# Patient Record
Sex: Female | Born: 1970 | Race: White | Hispanic: No | Smoking: Never smoker
Health system: Southern US, Community
[De-identification: ages and names within clinical notes are randomized; demographics above are authoritative.]

## PROBLEM LIST (undated history)

## (undated) DIAGNOSIS — L309 Dermatitis, unspecified: Secondary | ICD-10-CM

## (undated) DIAGNOSIS — I493 Ventricular premature depolarization: Secondary | ICD-10-CM

## (undated) DIAGNOSIS — G43909 Migraine, unspecified, not intractable, without status migrainosus: Secondary | ICD-10-CM

## (undated) DIAGNOSIS — I10 Essential (primary) hypertension: Secondary | ICD-10-CM

## (undated) HISTORY — DX: Dermatitis, unspecified: L30.9

## (undated) HISTORY — PX: TEMPOROMANDIBULAR JOINT ARTHROPLASTY: SUR76

## (undated) HISTORY — PX: NASAL SINUS SURGERY: SHX719

---

## 1997-08-06 ENCOUNTER — Observation Stay (HOSPITAL_COMMUNITY): Admission: AD | Admit: 1997-08-06 | Discharge: 1997-08-06 | Payer: Self-pay | Admitting: *Deleted

## 1997-08-09 ENCOUNTER — Other Ambulatory Visit: Admission: RE | Admit: 1997-08-09 | Discharge: 1997-08-09 | Payer: Self-pay | Admitting: Obstetrics and Gynecology

## 1997-10-06 ENCOUNTER — Inpatient Hospital Stay (HOSPITAL_COMMUNITY): Admission: AD | Admit: 1997-10-06 | Discharge: 1997-10-06 | Payer: Self-pay | Admitting: Obstetrics and Gynecology

## 1998-03-04 ENCOUNTER — Inpatient Hospital Stay (HOSPITAL_COMMUNITY): Admission: AD | Admit: 1998-03-04 | Discharge: 1998-03-06 | Payer: Self-pay | Admitting: Obstetrics and Gynecology

## 1998-03-08 ENCOUNTER — Encounter (HOSPITAL_COMMUNITY): Admission: RE | Admit: 1998-03-08 | Discharge: 1998-06-06 | Payer: Self-pay

## 1998-04-08 ENCOUNTER — Other Ambulatory Visit: Admission: RE | Admit: 1998-04-08 | Discharge: 1998-04-08 | Payer: Self-pay | Admitting: Obstetrics and Gynecology

## 1999-05-20 ENCOUNTER — Other Ambulatory Visit: Admission: RE | Admit: 1999-05-20 | Discharge: 1999-05-20 | Payer: Self-pay | Admitting: Obstetrics and Gynecology

## 2000-05-13 ENCOUNTER — Inpatient Hospital Stay (HOSPITAL_COMMUNITY): Admission: AD | Admit: 2000-05-13 | Discharge: 2000-05-18 | Payer: Self-pay | Admitting: Family Medicine

## 2000-05-14 ENCOUNTER — Encounter: Payer: Self-pay | Admitting: Family Medicine

## 2000-05-27 ENCOUNTER — Other Ambulatory Visit: Admission: RE | Admit: 2000-05-27 | Discharge: 2000-05-27 | Payer: Self-pay | Admitting: Obstetrics and Gynecology

## 2001-06-14 ENCOUNTER — Inpatient Hospital Stay (HOSPITAL_COMMUNITY): Admission: AD | Admit: 2001-06-14 | Discharge: 2001-06-16 | Payer: Self-pay | Admitting: Obstetrics and Gynecology

## 2001-07-29 ENCOUNTER — Other Ambulatory Visit: Admission: RE | Admit: 2001-07-29 | Discharge: 2001-07-29 | Payer: Self-pay | Admitting: Obstetrics and Gynecology

## 2004-06-12 ENCOUNTER — Encounter: Admission: RE | Admit: 2004-06-12 | Discharge: 2004-06-12 | Payer: Self-pay | Admitting: Family Medicine

## 2011-11-17 ENCOUNTER — Ambulatory Visit (HOSPITAL_COMMUNITY)
Admission: RE | Admit: 2011-11-17 | Discharge: 2011-11-17 | Disposition: A | Discharge status: Home / Self Care | Payer: BC Managed Care – PPO | Source: Ambulatory Visit | Attending: Family Medicine | Admitting: Family Medicine

## 2011-11-17 DIAGNOSIS — R609 Edema, unspecified: Secondary | ICD-10-CM | POA: Insufficient documentation

## 2011-11-17 DIAGNOSIS — R52 Pain, unspecified: Secondary | ICD-10-CM | POA: Insufficient documentation

## 2011-11-17 DIAGNOSIS — M79609 Pain in unspecified limb: Secondary | ICD-10-CM

## 2011-11-17 NOTE — Progress Notes (Signed)
VASCULAR LAB PRELIMINARY  PRELIMINARY  PRELIMINARY  PRELIMINARY  Right lower extremity venous duplex completed.    Preliminary report:  Right:  No evidence of DVT, superficial thrombosis, Baker's cyst or injury to the calf.   Aima Mcwhirt, RVT 11/17/2011, 5:51 PM

## 2012-09-05 ENCOUNTER — Emergency Department (HOSPITAL_COMMUNITY): Payer: BC Managed Care – PPO

## 2012-09-05 ENCOUNTER — Emergency Department (HOSPITAL_COMMUNITY)
Admission: EM | Admit: 2012-09-05 | Discharge: 2012-09-05 | Disposition: A | Discharge status: Home / Self Care | Payer: BC Managed Care – PPO | Attending: Emergency Medicine | Admitting: Emergency Medicine

## 2012-09-05 ENCOUNTER — Encounter (HOSPITAL_COMMUNITY): Payer: Self-pay | Admitting: Emergency Medicine

## 2012-09-05 DIAGNOSIS — R197 Diarrhea, unspecified: Secondary | ICD-10-CM | POA: Insufficient documentation

## 2012-09-05 DIAGNOSIS — R1013 Epigastric pain: Secondary | ICD-10-CM | POA: Insufficient documentation

## 2012-09-05 DIAGNOSIS — Z79899 Other long term (current) drug therapy: Secondary | ICD-10-CM | POA: Insufficient documentation

## 2012-09-05 DIAGNOSIS — E876 Hypokalemia: Secondary | ICD-10-CM | POA: Insufficient documentation

## 2012-09-05 DIAGNOSIS — Z885 Allergy status to narcotic agent status: Secondary | ICD-10-CM | POA: Insufficient documentation

## 2012-09-05 DIAGNOSIS — I10 Essential (primary) hypertension: Secondary | ICD-10-CM | POA: Insufficient documentation

## 2012-09-05 DIAGNOSIS — Z3202 Encounter for pregnancy test, result negative: Secondary | ICD-10-CM | POA: Insufficient documentation

## 2012-09-05 DIAGNOSIS — R112 Nausea with vomiting, unspecified: Secondary | ICD-10-CM | POA: Insufficient documentation

## 2012-09-05 DIAGNOSIS — Z880 Allergy status to penicillin: Secondary | ICD-10-CM | POA: Insufficient documentation

## 2012-09-05 HISTORY — DX: Essential (primary) hypertension: I10

## 2012-09-05 LAB — COMPREHENSIVE METABOLIC PANEL
Alkaline Phosphatase: 59 U/L (ref 39–117)
BUN: 10 mg/dL (ref 6–23)
CO2: 26 mEq/L (ref 19–32)
Chloride: 105 mEq/L (ref 96–112)
Creatinine, Ser: 0.72 mg/dL (ref 0.50–1.10)
GFR calc non Af Amer: >90 mL/min (ref >90)
Glucose, Bld: 83 mg/dL (ref 70–99)
Potassium: 3.4 mEq/L — ABNORMAL LOW (ref 3.5–5.1)
Total Bilirubin: 1.1 mg/dL (ref 0.3–1.2)

## 2012-09-05 LAB — CBC WITH DIFFERENTIAL/PLATELET
Basophils Absolute: 0 10*3/uL (ref 0.0–0.1)
Basophils Relative: 0 % (ref 0–1)
Eosinophils Relative: 1 % (ref 0–5)
Lymphocytes Relative: 30 % (ref 12–46)
MCHC: 35.7 g/dL (ref 30.0–36.0)
Monocytes Absolute: 0.4 10*3/uL (ref 0.1–1.0)
Neutro Abs: 4 10*3/uL (ref 1.7–7.7)
Platelets: 172 10*3/uL (ref 150–400)
RDW: 12 % (ref 11.5–15.5)
WBC: 6.3 10*3/uL (ref 4.0–10.5)

## 2012-09-05 LAB — URINALYSIS, ROUTINE W REFLEX MICROSCOPIC
Bilirubin Urine: NEGATIVE
Ketones, ur: >80 mg/dL — AB
Leukocytes, UA: NEGATIVE
Nitrite: NEGATIVE
Specific Gravity, Urine: 1.015 (ref 1.005–1.030)
Urobilinogen, UA: 0.2 mg/dL (ref 0.0–1.0)
pH: 6.5 (ref 5.0–8.0)

## 2012-09-05 LAB — LIPASE, BLOOD: Lipase: 14 U/L (ref 11–59)

## 2012-09-05 LAB — POCT PREGNANCY, URINE: Preg Test, Ur: NEGATIVE

## 2012-09-05 MED ORDER — GI COCKTAIL ~~LOC~~
30.0000 mL | Freq: Once | ORAL | Status: AC
  Administered 2012-09-05: 30 mL via ORAL
  Filled 2012-09-05: qty 30

## 2012-09-05 MED ORDER — OMEPRAZOLE 40 MG PO CPDR: 40.0000 mg | DELAYED_RELEASE_CAPSULE | Freq: Every day | ORAL | Status: DC

## 2012-09-05 NOTE — ED Notes (Signed)
Had migrain last night and vomited  X 2 t and now she is havimg upper epigastric ppain. tjhat  Started this am took a phenergan last night and it helped

## 2012-09-05 NOTE — ED Provider Notes (Signed)
I performed a history and physical examination of  Rebecca Martinez and discussed her management with Dr. Mikey Bussing. I agree with the history, physical, assessment, and plan of care, with the following exceptions: None I was present for the following procedures: None  Time Spent in Critical Care of the patient: None  Time spent in discussions with the patient and family: 10 minutes  Pt with epigastric pain. Cardiac risk factor - HTN. Unsure what the etiology is. All thel ab results are normal. No indication to get CT. Will request pcp f/u for possible GI f/u if not improved. Derwood Kaplan   Derwood Kaplan, MD 09/05/12 2102

## 2012-09-05 NOTE — ED Provider Notes (Signed)
CSN: [id number]     Arrival date & time 09/05/12  1009 History     First MD Initiated Contact with Patient 09/05/12 1111     Chief Complaint  Patient presents with  . Abdominal Pain   (Consider location/radiation/quality/duration/timing/severity/associated sxs/prior Treatment) Patient is a 42 y.o. female presenting with abdominal pain.  Abdominal Pain Associated symptoms: diarrhea (reports one episdoe of loose stool this AM.), nausea and vomiting   Associated symptoms: no chest pain, no constipation, no cough, no dysuria, no fatigue, no fever, no shortness of breath and no sore throat    Rebecca Martinez is a 42 year old female with PMH of HTN who presents to the ED this afternoon for epigastric pain that started this morning.  She reports that she started to feel nauseous last night and took a phenergan but did have two episodes of NBNB emesis.  She reports that after that she felt well and went to bed.  This morning when she got up she started to have epigastric pain. She denies any history of heartburn or reflux but felt that is what it could be.  She reports talking a tylenol, TUMS, and a nexium for the pain with some improvement of her symptoms.  She called a health professional friend of hers who recommended she come to the ED.  She also notes that she participated in the spartan race on Saturday and that may have contributed to her complaints although she notes feeling fine afterwards. She reports she does not take any medications regularly.  She previously took medication for HTN but has been taken off due to increased exercise.  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 She has Meridia IUD for birth control.  Past Medical History  Diagnosis Date  . Hypertension    History reviewed. No pertinent past surgical history. No family history on file. History  Substance Use Topics  . Smoking status: Never Smoker   . Smokeless tobacco: Not on file  . Alcohol Use: Yes    OB History   Grav Para Term Preterm Abortions TAB SAB Ect Mult Living                 Review of Systems  Constitutional: Negative for fever, activity change, appetite change and fatigue.  HENT: Negative for congestion, sore throat and rhinorrhea.   Eyes: Negative for visual disturbance.  Respiratory: Negative for cough, chest tightness, shortness of breath and wheezing.   Cardiovascular: Negative for chest pain, palpitations and leg swelling.  Gastrointestinal: Positive for nausea, vomiting, abdominal pain and diarrhea (reports one episdoe of loose stool this AM.). Negative for constipation, blood in stool, abdominal distention and anal bleeding.  Endocrine: Negative for polydipsia and polyuria.  Genitourinary: Negative for dysuria, flank pain, enuresis and pelvic pain.  Neurological: Negative for dizziness, tremors, syncope and headaches.  Psychiatric/Behavioral: Negative for confusion. The patient is not nervous/anxious.     Allergies  Shellfish allergy; Codeine; Penicillins; and Other  Home Medications   Current Outpatient Rx  Name  Route  Sig  Dispense  Refill  . acetaminophen (TYLENOL) 325 MG tablet   Oral   Take 650 mg by mouth every 6 (six) hours as needed for pain.         Marland Kitchen EPINEPHrine (EPI-PEN) 0.3 mg/0.3 mL SOAJ injection   Intramuscular   Inject 0.3 mg into the muscle once.         Marland Kitchen ibuprofen (ADVIL,MOTRIN) 200 MG tablet   Oral  Take 400 mg by mouth every 6 (six) hours as needed for pain.         Marland Kitchen levonorgestrel (MIRENA) 20 MCG/24HR IUD   Intrauterine   1 each by Intrauterine route once.          BP 144/72  Pulse 73  Temp(Src) 98.2 F (36.8 C)  Resp 16  SpO2 100% Physical Exam  Nursing note and vitals reviewed. Constitutional: She is oriented to person, place, and time. She appears well-developed and well-nourished.  HENT:  Head: Normocephalic and atraumatic.  Eyes: Conjunctivae are normal.  Cardiovascular: Normal rate, regular rhythm  and normal heart sounds.   No murmur heard. Pulmonary/Chest: Effort normal and breath sounds normal. No respiratory distress. She has no wheezes. She has no rales. She exhibits no tenderness.  Abdominal: Soft. Bowel sounds are normal. There is tenderness (Mild TTP epigastric region). There is no rebound and no guarding.  Musculoskeletal: She exhibits no edema.  Neurological: She is alert and oriented to person, place, and time.  Psychiatric: She has a normal mood and affect. Her behavior is normal. Thought content normal.    ED Course   Procedures (including critical care time)  Date: 09/05/2012  Rate: 60  Rhythm: normal sinus rhythm  QRS Axis: normal  Intervals: normal  ST/T Wave abnormalities: normal  Conduction Disutrbances:none  Narrative Interpretation: NSR, no ST/T wave changes.  Old EKG Reviewed: none available    Labs Reviewed  COMPREHENSIVE METABOLIC PANEL - Abnormal; Notable for the following:    Potassium 3.4 (*)    All other components within normal limits  URINALYSIS, ROUTINE W REFLEX MICROSCOPIC - Abnormal; Notable for the following:    Ketones, ur >80 (*)    All other components within normal limits  LIPASE, BLOOD  CBC WITH DIFFERENTIAL  POCT I-STAT TROPONIN I  POCT PREGNANCY, URINE   Dg Chest 2 View  09/05/2012   CLINICAL DATA:  Left-sided chest pain.  EXAM: CHEST  2 VIEW  COMPARISON:  None.  FINDINGS: The heart size and mediastinal contours are within normal limits. Both lungs are clear. The visualized skeletal structures are unremarkable.  IMPRESSION: No active cardiopulmonary disease.   Electronically Signed   By: Myles Rosenthal   On: 09/05/2012 12:29   1. Epigastric abdominal pain     MDM  42 year old female who presents to the emergency department for evaluation of epigastric pain, nausea, and vomiting. EKG showed no ST elevation.  Will obtain I stat trop. Obtain CMP, Lipase to evaluate for Hepatobiliary disease and pancreatitis. Obtain CXR -CBC w/  diff -UA and Urine pregnancy.  CXR showed no acute process, Trop neg, CBC was wnl, Lipase wnl, CMP was only significant for mild hypokalemia, no elevation of liver enzymes or bilirubin.  Urine preg was negative, UA showed some Ketones but was otherwise negative. Patient's pain was further improved with GI cocktail. Will discharge patient home with prescription from omeprazole for likely GERD.  Unlikely to be cardiac in nature given negative EKG, negative Troponin that was obtained 6 hours after onset of symptoms. In addition there were no signs of hepatobiliary dysfunction or pancreatitis.  Patient given return precautions and instructed to follow up with PCP.      Carlynn Purl, DO 09/05/12 1440

## 2013-03-01 ENCOUNTER — Telehealth: Payer: Self-pay | Admitting: Genetic Counselor

## 2013-03-01 NOTE — Telephone Encounter (Signed)
LEFT MESSAGE FOR PATIENT TO RETURN CALL TO SCHEDULE GENETIC APPT.  °

## 2014-12-07 ENCOUNTER — Emergency Department (HOSPITAL_BASED_OUTPATIENT_CLINIC_OR_DEPARTMENT_OTHER)
Admission: EM | Admit: 2014-12-07 | Discharge: 2014-12-07 | Disposition: A | Discharge status: Home / Self Care | Payer: BLUE CROSS/BLUE SHIELD | Attending: Emergency Medicine | Admitting: Emergency Medicine

## 2014-12-07 ENCOUNTER — Encounter (HOSPITAL_BASED_OUTPATIENT_CLINIC_OR_DEPARTMENT_OTHER): Payer: Self-pay | Admitting: *Deleted

## 2014-12-07 DIAGNOSIS — K529 Noninfective gastroenteritis and colitis, unspecified: Secondary | ICD-10-CM | POA: Diagnosis not present

## 2014-12-07 DIAGNOSIS — Z880 Allergy status to penicillin: Secondary | ICD-10-CM | POA: Diagnosis not present

## 2014-12-07 DIAGNOSIS — Z3202 Encounter for pregnancy test, result negative: Secondary | ICD-10-CM | POA: Insufficient documentation

## 2014-12-07 DIAGNOSIS — Z79899 Other long term (current) drug therapy: Secondary | ICD-10-CM | POA: Insufficient documentation

## 2014-12-07 DIAGNOSIS — R112 Nausea with vomiting, unspecified: Secondary | ICD-10-CM | POA: Diagnosis present

## 2014-12-07 DIAGNOSIS — I10 Essential (primary) hypertension: Secondary | ICD-10-CM | POA: Insufficient documentation

## 2014-12-07 LAB — COMPREHENSIVE METABOLIC PANEL
ALT: 13 U/L — AB (ref 14–54)
AST: 19 U/L (ref 15–41)
Albumin: 4.1 g/dL (ref 3.5–5.0)
Alkaline Phosphatase: 58 U/L (ref 38–126)
Anion gap: 8 (ref 5–15)
BILIRUBIN TOTAL: 1.4 mg/dL — AB (ref 0.3–1.2)
BUN: 16 mg/dL (ref 6–20)
CHLORIDE: 105 mmol/L (ref 101–111)
CO2: 26 mmol/L (ref 22–32)
CREATININE: 0.78 mg/dL (ref 0.44–1.00)
Calcium: 8.6 mg/dL — ABNORMAL LOW (ref 8.9–10.3)
GFR calc Af Amer: >60 mL/min (ref >60)
Glucose, Bld: 125 mg/dL — ABNORMAL HIGH (ref 65–99)
Potassium: 3.2 mmol/L — ABNORMAL LOW (ref 3.5–5.1)
Sodium: 139 mmol/L (ref 135–145)
Total Protein: 6.6 g/dL (ref 6.5–8.1)

## 2014-12-07 LAB — CBC
HEMATOCRIT: 43.1 % (ref 36.0–46.0)
HEMOGLOBIN: 15 g/dL (ref 12.0–15.0)
MCH: 29.9 pg (ref 26.0–34.0)
MCHC: 34.8 g/dL (ref 30.0–36.0)
MCV: 86 fL (ref 78.0–100.0)
PLATELETS: 201 10*3/uL (ref 150–400)
RBC: 5.01 MIL/uL (ref 3.87–5.11)
RDW: 12.1 % (ref 11.5–15.5)
WBC: 8.4 10*3/uL (ref 4.0–10.5)

## 2014-12-07 LAB — URINALYSIS, ROUTINE W REFLEX MICROSCOPIC
Glucose, UA: NEGATIVE mg/dL
Hgb urine dipstick: NEGATIVE
KETONES UR: 40 mg/dL — AB
NITRITE: NEGATIVE
PROTEIN: 30 mg/dL — AB
Specific Gravity, Urine: 1.025 (ref 1.005–1.030)
pH: 8.5 — ABNORMAL HIGH (ref 5.0–8.0)

## 2014-12-07 LAB — URINE MICROSCOPIC-ADD ON: RBC / HPF: NONE SEEN RBC/hpf (ref 0–5)

## 2014-12-07 LAB — PREGNANCY, URINE: PREG TEST UR: NEGATIVE

## 2014-12-07 LAB — LIPASE, BLOOD: LIPASE: 37 U/L (ref 11–51)

## 2014-12-07 MED ORDER — ONDANSETRON 4 MG PO TBDP: 4.0000 mg | ORAL_TABLET | Freq: Three times a day (TID) | ORAL | Status: DC | PRN

## 2014-12-07 MED ORDER — SODIUM CHLORIDE 0.9 % IV BOLUS (SEPSIS)
1000.0000 mL | Freq: Once | INTRAVENOUS | Status: AC
Start: 2014-12-07 — End: 2014-12-07
  Administered 2014-12-07: 1000 mL via INTRAVENOUS

## 2014-12-07 MED ORDER — ONDANSETRON HCL 4 MG/2ML IJ SOLN
4.0000 mg | Freq: Once | INTRAMUSCULAR | Status: AC
  Administered 2014-12-07: 4 mg via INTRAVENOUS
  Filled 2014-12-07: qty 2

## 2014-12-07 MED ORDER — ONDANSETRON HCL 4 MG/2ML IJ SOLN
4.0000 mg | Freq: Once | INTRAMUSCULAR | Status: AC | PRN
  Administered 2014-12-07: 4 mg via INTRAVENOUS
  Filled 2014-12-07: qty 2

## 2014-12-07 MED ORDER — POTASSIUM CHLORIDE CRYS ER 20 MEQ PO TBCR
40.0000 meq | EXTENDED_RELEASE_TABLET | Freq: Once | ORAL | Status: AC
  Administered 2014-12-07: 40 meq via ORAL
  Filled 2014-12-07: qty 2

## 2014-12-07 NOTE — ED Notes (Signed)
Pt reports n/v x 9 times since 2pm. Unable to keep phenergan down

## 2014-12-07 NOTE — Discharge Instructions (Signed)
Please read and follow all provided instructions.  Your diagnoses today include:  1. Gastroenteritis     Tests performed today include:  Blood counts and electrolytes - slightly low potassium  Blood tests to check liver and kidney function  Urine test to look for infection and pregnancy (in women)  Vital signs. See below for your results today.   Medications prescribed:   Zofran (ondansetron) - for nausea and vomiting  Take any prescribed medications only as directed.  Home care instructions:   Follow any educational materials contained in this packet.   Your abdominal pain, nausea, vomiting, and diarrhea may be caused by a viral gastroenteritis also called 'stomach flu'. You should rest for the next several days. Keep drinking plenty of fluids and use the medicine for nausea as directed.    Drink clear liquids for the next 24 hours and introduce solid foods slowly after 24 hours using the b.r.a.t. diet (Bananas, Rice, Applesauce, Toast, Yogurt).    Follow-up instructions: Please follow-up with your primary care provider in the next 3 days for further evaluation of your symptoms. If you are not feeling better in 48 hours you may have a condition that is more serious and you need re-evaluation.   Return instructions:  SEEK IMMEDIATE MEDICAL ATTENTION IF:  If you have pain that does not go away or becomes severe   A temperature above 101F develops   Repeated vomiting occurs (multiple episodes)   If you have pain that becomes localized to portions of the abdomen. The right side could possibly be appendicitis. In an adult, the left lower portion of the abdomen could be colitis or diverticulitis.   Blood is being passed in stools or vomit (bright red or black tarry stools)   You develop chest pain, difficulty breathing, dizziness or fainting, or become confused, poorly responsive, or inconsolable (young children)  If you have any other emergent concerns regarding your  health  Additional Information: Abdominal (belly) pain can be caused by many things. Your caregiver performed an examination and possibly ordered blood/urine tests and imaging (CT scan, x-rays, ultrasound). Many cases can be observed and treated at home after initial evaluation in the emergency department. Even though you are being discharged home, abdominal pain can be unpredictable. Therefore, you need a repeated exam if your pain does not resolve, returns, or worsens. Most patients with abdominal pain don't have to be admitted to the hospital or have surgery, but serious problems like appendicitis and gallbladder attacks can start out as nonspecific pain. Many abdominal conditions cannot be diagnosed in one visit, so follow-up evaluations are very important.  Your vital signs today were: BP 116/58 mmHg   Pulse 86   Temp(Src) 98.3 F (36.8 C) (Oral)   Resp 20   Ht 5\' 6"  (1.676 m)   Wt 56.7 kg   BMI 20.19 kg/m2   SpO2 100%   LMP 11/20/2014 If your blood pressure (bp) was elevated above 135/85 this visit, please have this repeated by your doctor within one month. --------------

## 2014-12-07 NOTE — ED Provider Notes (Signed)
CSN: [id number]     Arrival date & time 12/07/14  1910 History   First MD Initiated Contact with Patient 12/07/14 1940     Chief Complaint  Patient presents with  . Emesis   (Consider location/radiation/quality/duration/timing/severity/associated sxs/prior Treatment) HPI Comments: Patient presents with acute onset of nausea, multiple episodes of vomiting, nonbloody diarrhea starting approximately 2 PM today. Symptoms associated with some crampy upper abdominal pain. No fevers. Patient try to take Phenergan but vomited shortly afterwards. No suspicious food or water exposures. No urinary symptoms. No headache or URI symptoms. No known sick contacts. No history of abdominal surgeries. Denies recent alcohol use. Denies heavy NSAID use. The onset of this condition was acute. The course is constant. Aggravating factors: none. Alleviating factors: none.    Patient is a 44 y.o. female presenting with vomiting. The history is provided by the patient.  Emesis Associated symptoms: abdominal pain (epigastric) and diarrhea   Associated symptoms: no myalgias and no sore throat     Past Medical History  Diagnosis Date  . Hypertension    Past Surgical History  Procedure Laterality Date  . Temporomandibular joint arthroplasty    . Nasal sinus surgery     No family history on file. Social History  Substance Use Topics  . Smoking status: Never Smoker   . Smokeless tobacco: Never Used  . Alcohol Use: Yes     Comment: Rare   OB History    No data available     Review of Systems  Constitutional: Negative for fever and appetite change.  HENT: Negative for rhinorrhea and sore throat.   Eyes: Negative for redness.  Respiratory: Negative for cough.   Cardiovascular: Negative for chest pain.  Gastrointestinal: Positive for nausea, vomiting, abdominal pain (epigastric) and diarrhea. Negative for blood in stool.       Negative for hematemesis  Genitourinary: Negative for dysuria.   Musculoskeletal: Negative for myalgias.  Skin: Negative for rash.  Neurological: Negative for light-headedness.    Allergies  Shellfish allergy; Codeine; Penicillins; and Other  Home Medications   Prior to Admission medications   Medication Sig Start Date End Date Taking? Authorizing Provider  EPINEPHrine (EPI-PEN) 0.3 mg/0.3 mL SOAJ injection Inject 0.3 mg into the muscle once.   Yes Historical Provider, MD  hydrochlorothiazide (MICROZIDE) 12.5 MG capsule Take 12.5 mg by mouth daily.   Yes Historical Provider, MD  levonorgestrel (MIRENA) 20 MCG/24HR IUD 1 each by Intrauterine route once.   Yes Historical Provider, MD  acetaminophen (TYLENOL) 325 MG tablet Take 650 mg by mouth every 6 (six) hours as needed for pain.    Historical Provider, MD  ibuprofen (ADVIL,MOTRIN) 200 MG tablet Take 400 mg by mouth every 6 (six) hours as needed for pain.    Historical Provider, MD  omeprazole (PRILOSEC) 40 MG capsule Take 1 capsule (40 mg total) by mouth daily. 09/05/12   Gust Rung, DO  ondansetron (ZOFRAN ODT) 4 MG disintegrating tablet Take 1 tablet (4 mg total) by mouth every 8 (eight) hours as needed for nausea or vomiting. 12/07/14   Renne Crigler, PA-C   BP 116/58 mmHg  Pulse 86  Temp(Src) 98.3 F (36.8 C) (Oral)  Resp 20  Ht  (1.676 m)  Wt 56.7 kg  BMI 20.19 kg/m2  SpO2 100%  LMP 11/20/2014   Physical Exam  Constitutional: She appears well-developed and well-nourished.  HENT:  Head: Normocephalic and atraumatic.  Eyes: Conjunctivae are normal. Right eye exhibits no discharge. Left eye exhibits  no discharge.  Neck: Normal range of motion. Neck supple.  Cardiovascular: Normal rate, regular rhythm and normal heart sounds.   Pulmonary/Chest: Effort normal and breath sounds normal.  Abdominal: Soft. Bowel sounds are normal. There is tenderness. There is no rebound and no guarding.  Mild epigastric tenderness to palpation  Neurological: She is alert.  Skin: Skin is warm and  dry.  Psychiatric: She has a normal mood and affect.  Nursing note and vitals reviewed.   ED Course  Procedures (including critical care time) Labs Review Labs Reviewed  URINALYSIS, ROUTINE W REFLEX MICROSCOPIC (NOT AT [facility]) - Abnormal; Notable for the following:    pH 8.5 (*)    Bilirubin Urine SMALL (*)    Ketones, ur 40 (*)    Protein, ur 30 (*)    Leukocytes, UA TRACE (*)    All other components within normal limits  URINE MICROSCOPIC-ADD ON - Abnormal; Notable for the following:    Squamous Epithelial / LPF 0-5 (*)    Bacteria, UA RARE (*)    All other components within normal limits  COMPREHENSIVE METABOLIC PANEL - Abnormal; Notable for the following:    Potassium 3.2 (*)    Glucose, Bld 125 (*)    Calcium 8.6 (*)    ALT 13 (*)    Total Bilirubin 1.4 (*)    All other components within normal limits  CBC  PREGNANCY, URINE  LIPASE, BLOOD    Imaging Review No results found. I have personally reviewed and evaluated these images and lab results as part of my medical decision-making.   EKG Interpretation None       Patient seen and examined. Work-up initiated. Medications ordered.   Vital signs reviewed and are as follows: BP 116/58 mmHg  Pulse 86  Temp(Src) 98.3 F (36.8 C) (Oral)  Resp 20  Ht 5\' 6"  (1.676 m)  Wt 56.7 kg  BMI 20.19 kg/m2  SpO2 100%  LMP 11/20/2014  Patient has received 1 L of fluids. Symptoms are improving. She has tolerated a few sips of water. Potassium ordered.  Will give 1 additional dose of Zofran and I have encouraged her to drink a little more water to ensure that she does not vomit.  Handoff to Dr. Littie DeedsGentry at shift change. Plan: Discharge to home if symptoms remain controlled in the next 30-45 minutes.  MDM   Final diagnoses:  Gastroenteritis   Patient with symptoms consistent with viral gastroenteritis. Vitals are stable, no fever.  No signs of dehydration, tolerating PO's. Lungs are clear. No focal abdominal pain, no concern  for appendicitis, cholecystitis, pancreatitis, ruptured viscus, UTI, kidney stone, or any other emergent abdominal etiology. Supportive therapy indicated with return if symptoms worsen. Patient counseled.      Renne CriglerJoshua Kendel Bessey, PA-C 12/07/14 2159  Mirian MoMatthew Gentry, MD 12/08/14 [phone]

## 2018-01-11 ENCOUNTER — Encounter: Payer: Self-pay | Admitting: Physical Therapy

## 2018-01-11 ENCOUNTER — Ambulatory Visit: Payer: 59 | Attending: Student | Admitting: Physical Therapy

## 2018-01-11 DIAGNOSIS — M25561 Pain in right knee: Secondary | ICD-10-CM | POA: Insufficient documentation

## 2018-01-11 DIAGNOSIS — M25562 Pain in left knee: Secondary | ICD-10-CM | POA: Diagnosis not present

## 2018-01-11 NOTE — Therapy (Signed)
[facility] [address] [place], [place], [address] Phone: [phone]   Fax:  [phone]  Physical Therapy Evaluation  Patient Details  Name: Rebecca PicklesJennifer R Robarge MRN: [id number] Date of Birth: 1970-09-22 Referring Provider (PT): Arther AbbottEdmisten, Kristie, [place],   Encounter Date: 01/11/2018  PT End of Session - 01/11/18 1507    Visit Number  1    Number of Visits  8    Date for PT Re-Evaluation  02/15/18    Authorization Type  UHC    PT Start Time  1400    PT Stop Time  1447    PT Time Calculation (min)  47 min    Activity Tolerance  Patient tolerated treatment well    Behavior During Therapy  [facility] for tasks assessed/performed       Past Medical History:  Diagnosis Date  . Hypertension     Past Surgical History:  Procedure Laterality Date  . NASAL SINUS SURGERY    . TEMPOROMANDIBULAR JOINT ARTHROPLASTY      There were no vitals filed for this visit.   Subjective Assessment - 01/11/18 1405    Subjective  Pt relays she runs and has ran since college where she ran cross country and 2 mile track competitively. She has had more knee pain Lt more than Rt for the last 2 months. 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 She has pain with going downstairs but not upstairs. She continues to exercise and run but she was not been able to run as much due to knee pain and the weather.     Pertinent History  HBP, IT band syndrome     Limitations  Lifting;Standing;Walking    How long can you stand comfortably?  depends on shoes    Diagnostic tests  x-rays show some OA and chondromalcia per pt report    Currently in Pain?  Yes    Pain Score  --   no pain at rest, up to 7-8 after .5 miles of running   Pain Location  Knee    Pain Orientation  Right;Left;Anterior    Pain Descriptors / Indicators  Aching;Sore    Pain Type  Chronic pain   acute on chronic   Pain Radiating Towards  denies     Pain Onset  More than a month ago    Pain Frequency  Intermittent    Aggravating Factors   run, stairs, prolonged standing in heels    Pain Relieving Factors  rest, NSAIDS    Multiple Pain Sites  No         [facility] PT Assessment - 01/11/18 0001      Assessment   Medical Diagnosis  bilat  knee pain, chondromalacia patellae    Referring Provider (PT)  Arther AbbottEdmisten, Kristie, [place],    Onset Date/Surgical Date  --   acute 2 month onset on chronic knee pain   Next MD Visit  02/18/18    Prior Therapy  none      Precautions   Precautions  None      Restrictions   Weight Bearing Restrictions  No      Balance Screen   Has the patient fallen in the past 6 months  No      Prior Function   Level of Independence  Independent      Cognition   Overall Cognitive Status  Within Functional  Limits for tasks assessed      Observation/Other Assessments   Focus on Therapeutic Outcomes (FOTO)   38% limited, 30% goal      Sensation   Light Touch  Appears Intact      Coordination   Gross Motor Movements are Fluid and Coordinated  Yes      Posture/Postural Control   Posture Comments  slight knee valgus      ROM / Strength   AROM / PROM / Strength  AROM;Strength      AROM   AROM Assessment Site  Knee    Right/Left Knee  Right;Left    Right Knee Extension  0    Right Knee Flexion  0    Left Knee Extension  137    Left Knee Flexion  130      Strength   Overall Strength Comments  knee strength 5/5 MMT bilat, hip strength 4 to 4+/5 MMT bilat       Flexibility   Soft Tissue Assessment /Muscle Length  --   tight IT band, tight hip flexor, tight quads     Palpation   Patella mobility  good, some lateral tracking in Rt knee    Palpation comment  TTP anterior knee      Special Tests   Other special tests  + patellar grind test, inconclusive meniscus testing, neg ligamentous testing      Ambulation/Gait   Gait Comments  WFL gait and jogging pattern                Objective  measurements completed on examination: See above findings.      [facility] Adult PT Treatment/Exercise - 01/11/18 0001      Exercises   Exercises  Knee/Hip      Knee/Hip Exercises: Stretches   Quad Stretch  Right;Left;1 rep;30 seconds    Hip Flexor Stretch  Right;Left;1 rep;30 seconds      Knee/Hip Exercises: Aerobic   Tread Mill  jogging 2 min 5.0MPH to examine form, shows good mid foot strike with good cadence and step length             PT Education - 01/11/18 1503    Education Details  HEP, POC, need for dynamic warm up before jogging, need to stretch before and after running, return to running program of walk, jog intervals.     Person(s) Educated  Patient    Methods  Explanation;Demonstration;Verbal cues;Handout    Comprehension  Verbalized understanding;Need further instruction          PT Long Term Goals - 01/11/18 1521      PT LONG TERM GOAL #1   Title  Pt will be I and compliant with HEP. (5 weeks 02/15/18)    Status  New      PT LONG TERM GOAL #2   Title  Pt will increase hip strength to 5/5 MMT all planes. (5 weeks 02/15/18)    Status  New      PT LONG TERM GOAL #3   Title  Pt will improve FOTO To less than 30% limited (5 weeks 02/15/18)    Status  New      PT LONG TERM GOAL #4   Title  Pt will be able to perform usual activity and return to jogging at least 2 miles, and walk up/down stairs with less than 2/10 pain overall. (5 weeks 02/15/18)    Status  New  Plan - 01/11/18 1508    Clinical Impression Statement  Pt presents with bilat knee pain, patellafemoral syndrome and chondromalacia patellae. Her Rt knee is no longer bothering her but her Lt knee has been bothering her for last 2 months and is worse with jogging, prolonged standing, wearing heels, or going down stairs. She has decreased flexability,decreased Lt knee flexion ROM, decreased strength, and increased pain limiting her full function. She will benefit from skilled PT to address  her defecits. She was encouraged to take break from running and use other forms of cardo such as cycling, elliptical, or walking inclines then will progress to jog/walk intervals before performing extended steady state jogging.     History and Personal Factors relevant to plan of care:  acute on chronic pain    Clinical Presentation  Stable    Clinical Decision Making  Low    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    PT Frequency  2x / week    PT Duration  4 weeks    PT Treatment/Interventions  Cryotherapy;Electrical Stimulation;Iontophoresis 4mg /ml Dexamethasone;Moist Heat;Ultrasound;Gait training;Stair training;Therapeutic activities;Therapeutic exercise;Neuromuscular re-education;Manual techniques;Passive range of motion;Dry needling;Taping;Joint Manipulations    PT Next Visit Plan  knee stretching and strengthening, progress Single leg activities and eccentric loading     PT Home Exercise Plan  Access Code: [email or web address]    Consulted and Agree with Plan of Care  Patient       Patient will benefit from skilled therapeutic intervention in order to improve the following deficits and impairments:  Decreased activity tolerance, Decreased endurance, Decreased range of motion, Decreased strength, Difficulty walking, Impaired flexibility, Increased fascial restricitons, Pain  Visit Diagnosis: Left knee pain, unspecified chronicity  Right knee pain, unspecified chronicity     Problem List There are no active problems to display for this patient.   Birdie Riddle 01/11/2018, 3:27 PM  [facility] [place] [facility] [address] [place], [place], [address] Phone: [phone]   Fax:  [phone]  Name: RISHA BARRETTA MRN: [id number] Date of Birth: 1970/12/31

## 2018-01-11 NOTE — Patient Instructions (Signed)
Access Code: [email or web address]  URL: [email or web address]  Date: 01/11/2018  Prepared by: Ivery QualeBrian Christian Borgerding   Exercises  Hip Flexor Stretch with Chair - 3 sets - 30 hold - 2x daily - 6x weekly  Standing Quadriceps Stretch - 10 reps - 3 sets - 2x daily - 6x weekly  Supine ITB Stretch with Strap - 3 sets - 30 hold - 2x daily - 6x weekly  Supine Active Straight Leg Raise - 10 reps - 1-3 sets - 2x daily - 6x weekly  Sidelying Hip Abduction - 10 reps - 3 sets - 2x daily - 6x weekly  Prone Hip Extension - 10 reps - 3 sets - 2x daily - 6x weekly  Sidelying Hip Adduction - 10 reps - 3 sets - 2x daily - 6x weekly  Single Leg Deadlift with Kettlebell - 10 reps - 3 sets - 2x daily - 6x weekly  Single Leg Sit to Stand with Arms Extended - 10 reps - 3 sets - 2x daily - 6x weekly  Side Stepping with Resistance at Thighs - 10 reps - 3 sets - 2x daily - 6x weekly

## 2018-01-18 ENCOUNTER — Ambulatory Visit: Payer: BLUE CROSS/BLUE SHIELD | Attending: Student | Admitting: Physical Therapy

## 2018-01-18 DIAGNOSIS — M25561 Pain in right knee: Secondary | ICD-10-CM | POA: Insufficient documentation

## 2018-01-18 DIAGNOSIS — M25562 Pain in left knee: Secondary | ICD-10-CM | POA: Diagnosis not present

## 2018-01-18 NOTE — Therapy (Signed)
[facility] [place] [facility] [address] [place], [place], [address] Phone: [phone]   Fax:  [phone]  Physical Therapy Treatment  Patient Details  Name: Rebecca Martinez MRN: [id number] Date of Birth: 08/27/1970 Referring Provider (PT): Arther Abbott, [place],   Encounter Date: 01/18/2018  PT End of Session - 01/18/18 1319    Visit Number  2    Number of Visits  8    Date for PT Re-Evaluation  02/15/18    Authorization Type  UHC    PT Start Time  1225    PT Stop Time  1305    PT Time Calculation (min)  40 min       Past Medical History:  Diagnosis Date  . Hypertension     Past Surgical History:  Procedure Laterality Date  . NASAL SINUS SURGERY    . TEMPOROMANDIBULAR JOINT ARTHROPLASTY      There were no vitals filed for this visit.  Subjective Assessment - 01/18/18 1312    Subjective  doing ex and stretches    Currently in Pain?  Yes    Pain Score  3     Pain Location  Leg    Pain Orientation  Right;Left                       [facility] Adult PT Treatment/Exercise - 01/18/18 0001      Knee/Hip Exercises: Machines for Strengthening   Cybex Knee Extension  15 # 2 sets 10 ball squeeze   educ for ball squeeze at gym   Total Gym Leg Press  40# 2 sets 10 with ball squeeze   educ on VMP squeeze at gym     Knee/Hip Exercises: Standing   Wall Squat  2 sets;20 reps   ball squeeze   Other Standing Knee Exercises  green tband 15 reps BIL SLR and SLR with ER      Knee/Hip Exercises: Seated   Long Arc Quad  Strengthening;Both;15 reps   green tband,ball squeeze     Manual Therapy   Manual Therapy  Soft tissue mobilization;Passive ROM;Taping    Soft tissue mobilization  ITB stripping   educ on doing at home with foam roller   Passive ROM  LE     Kinesiotex  Create Space   BIL IT            PT Education - 01/18/18 1318    Education Details  HEP with green tband SLR and SLR with ER standing,  LAQ with VMO squeeze. educ on modifications at gym , ITB foam rolling    Person(s) Educated  Patient    Methods  Explanation;Demonstration    Comprehension  Verbalized understanding;Returned demonstration          PT Long Term Goals - 01/11/18 1521      PT LONG TERM GOAL #1   Title  Pt will be I and compliant with HEP. (5 weeks 02/15/18)    Status  New      PT LONG TERM GOAL #2   Title  Pt will increase hip strength to 5/5 MMT all planes. (5 weeks 02/15/18)    Status  New      PT LONG TERM GOAL #3   Title  Pt will improve FOTO To less than 30% limited (5 weeks 02/15/18)    Status  New      PT LONG TERM GOAL #4   Title  Pt will  be able to perform usual activity and return to jogging at least 2 miles, and walk up/down stairs with less than 2/10 pain overall. (5 weeks 02/15/18)    Status  New            Plan - 01/18/18 1320    Clinical Impression Statement  pt with very tight ITB. spent time educ on gym modifications. educ on ITB foam rolling and issued VMO HEP    PT Treatment/Interventions  Cryotherapy;Electrical Stimulation;Iontophoresis 4mg /ml Dexamethasone;Moist Heat;Ultrasound;Gait training;Stair training;Therapeutic activities;Therapeutic exercise;Neuromuscular re-education;Manual techniques;Passive range of motion;Dry needling;Taping;Joint Manipulations    PT Next Visit Plan  assess and progress       Patient will benefit from skilled therapeutic intervention in order to improve the following deficits and impairments:  Decreased activity tolerance, Decreased endurance, Decreased range of motion, Decreased strength, Difficulty walking, Impaired flexibility, Increased fascial restricitons, Pain  Visit Diagnosis: Left knee pain, unspecified chronicity  Right knee pain, unspecified chronicity     Problem List There are no active problems to display for this patient.   PAYSEUR,ANGIE PTA 01/18/2018, 1:22 PM  [facility] [place]  [facility] [address] [place], [place], [address] Phone: [phone]   Fax:  [phone]  Name: FREDONIA DESHAIES MRN: [id number] Date of Birth: 1970/10/23

## 2018-01-20 ENCOUNTER — Ambulatory Visit: Payer: BLUE CROSS/BLUE SHIELD | Admitting: Physical Therapy

## 2018-01-20 DIAGNOSIS — M25561 Pain in right knee: Secondary | ICD-10-CM | POA: Diagnosis not present

## 2018-01-20 DIAGNOSIS — M25562 Pain in left knee: Secondary | ICD-10-CM | POA: Diagnosis not present

## 2018-01-20 NOTE — Therapy (Signed)
[facility] [place] [facility] [address] [place], [place], [address] Phone: [phone]   Fax:  [phone]  Physical Therapy Treatment  Patient Details  Name: Rebecca Martinez MRN: [id number] Date of Birth: October 06, 1970 Referring Provider (PT): Arther Abbott, [place],   Encounter Date: 01/20/2018  PT End of Session - 01/20/18 1430    Visit Number  3    Number of Visits  8    Date for PT Re-Evaluation  02/15/18    PT Start Time  1400    PT Stop Time  1445    PT Time Calculation (min)  45 min       Past Medical History:  Diagnosis Date  . Hypertension     Past Surgical History:  Procedure Laterality Date  . NASAL SINUS SURGERY    . TEMPOROMANDIBULAR JOINT ARTHROPLASTY      There were no vitals filed for this visit.  Subjective Assessment - 01/20/18 1415    Subjective  did ex and foam rolling    Currently in Pain?  Yes    Pain Location  Leg    Pain Orientation  Right;Left                       [facility] Adult PT Treatment/Exercise - 01/20/18 0001      Modalities   Modalities  Electrical Stimulation;Moist Heat      Moist Heat Therapy   Number Minutes Moist Heat  15 Minutes    Moist Heat Location  Hip      Electrical Stimulation   Electrical Stimulation Location  ITB    Electrical Stimulation Action  IFC    Electrical Stimulation Parameters  supine    Electrical Stimulation Goals  Pain      Manual Therapy   Manual Therapy  Soft tissue mobilization;Passive ROM    Soft tissue mobilization  ITB stripping    Passive ROM  LE        Trigger Point Dry Needling - 01/20/18 1746    Consent Given?  Yes    Education Handout Provided  Yes    Muscles Treated Lower Body  Tensor fascia lata   lateralis/ITB area   Tensor Fascia Lata Response  Twitch response elicited;Palpable increased muscle length                PT Long Term Goals - 01/11/18 1521      PT LONG TERM GOAL #1   Title  Pt will be I  and compliant with HEP. (5 weeks 02/15/18)    Status  New      PT LONG TERM GOAL #2   Title  Pt will increase hip strength to 5/5 MMT all planes. (5 weeks 02/15/18)    Status  New      PT LONG TERM GOAL #3   Title  Pt will improve FOTO To less than 30% limited (5 weeks 02/15/18)    Status  New      PT LONG TERM GOAL #4   Title  Pt will be able to perform usual activity and return to jogging at least 2 miles, and walk up/down stairs with less than 2/10 pain overall. (5 weeks 02/15/18)    Status  New            Plan - 01/20/18 1430    Clinical Impression Statement  focus session on ITB ,still tight but some loosening so more trigger pts noted. focus on  those as pt just left gym an dhad done cardio and ther ex incorp in HEP    PT Treatment/Interventions  Cryotherapy;Electrical Stimulation;Iontophoresis 4mg /ml Dexamethasone;Moist Heat;Ultrasound;Gait training;Stair training;Therapeutic activities;Therapeutic exercise;Neuromuscular re-education;Manual techniques;Passive range of motion;Dry needling;Taping;Joint Manipulations    PT Next Visit Plan  assess and progress       Patient will benefit from skilled therapeutic intervention in order to improve the following deficits and impairments:  Decreased activity tolerance, Decreased endurance, Decreased range of motion, Decreased strength, Difficulty walking, Impaired flexibility, Increased fascial restricitons, Pain  Visit Diagnosis: Left knee pain, unspecified chronicity  Right knee pain, unspecified chronicity     Problem List There are no active problems to display for this patient.   Jearld Lesch., PT 01/20/2018, 5:47 PM  [facility] [place] [facility] [address] [place], [place], [address] Phone: [phone]   Fax:  [phone]  Name: Rebecca Martinez MRN: [id number] Date of Birth: 03/06/1970

## 2018-01-20 NOTE — Patient Instructions (Signed)

## 2018-02-01 ENCOUNTER — Ambulatory Visit: Payer: BLUE CROSS/BLUE SHIELD | Admitting: Physical Therapy

## 2018-02-01 DIAGNOSIS — M25561 Pain in right knee: Secondary | ICD-10-CM

## 2018-02-01 DIAGNOSIS — M25562 Pain in left knee: Secondary | ICD-10-CM | POA: Diagnosis not present

## 2018-02-01 NOTE — Therapy (Signed)
[place], [place], [address] Phone: [phone]   Fax:  [phone]  Physical Therapy Treatment  Patient Details  Name: Rebecca Martinez MRN: [id number] Date of Birth: [birth year] Referring Provider (PT): Theresa Duty, [place],   Encounter Date: 02/01/2018  PT End of Session - 02/01/18 0846    Visit Number  4    Number of Visits  8    Date for PT Re-Evaluation  02/15/18    Authorization Type  UHC    PT Start Time  0846    PT Stop Time  0949    PT Time Calculation (min)  63 min    Activity Tolerance  Patient tolerated treatment well    Behavior During Therapy  [facility] for tasks assessed/performed       Past Medical History:  Diagnosis Date  . Hypertension     Past Surgical History:  Procedure Laterality Date  . NASAL SINUS SURGERY    . TEMPOROMANDIBULAR JOINT ARTHROPLASTY      There were no vitals filed for this visit.  Subjective Assessment - 02/01/18 0850    Subjective  No pain this morning but yesterday was unable to do standing knee extension with hip at 90 deg.     Pertinent History  HBP, IT band syndrome     Limitations  Lifting;Standing;Walking    How long can you stand comfortably?  depends on shoes    Diagnostic tests  x-rays show some OA and chondromalcia per pt report    Currently in Pain?  No/denies                       [facility] Adult PT Treatment/Exercise - 02/01/18 0001      Knee/Hip Exercises: Stretches   ITB Stretch  Both;1 rep;30 seconds    ITB Stretch Limitations  with strap across body and modified fig 4 with rotation     Knee/Hip Exercises: Aerobic   Elliptical  I5 R5 x 5 min      Knee/Hip Exercises: Standing   Step Down  Both;3 sets;10 reps;Hand Hold: 1;Step Height: 4";Step Height: 6";Limitations    Step Down Limitations  stay in painfree range    Functional Squat  15 reps    Functional Squat Limitations  on decline    Other Standing Knee  Exercises  on bosu: step up opp hip ABD 2x10 bil; step up knee raise 2x10 bil    Other Standing Knee Exercises  single leg dead lift no wt x 5 each      Modalities   Modalities  Electrical Stimulation;Moist Heat      Moist Heat Therapy   Number Minutes Moist Heat  15 Minutes    Moist Heat Location  Hip      Electrical Stimulation   Electrical Stimulation Location  Bil ITB    Electrical Stimulation Action  premod    Electrical Stimulation Parameters  supine    Electrical Stimulation Goals  Pain      Manual Therapy   Manual Therapy  Soft tissue mobilization;Myofascial release    Soft tissue mobilization  to bil quads, TFL    Myofascial Release  ITB stripping and rolling of quads on foam                  PT Long Term Goals - 02/01/18 1248      PT LONG TERM GOAL #1   Title  Pt will  be I and compliant with HEP. (5 weeks 02/15/18)    Status  Partially Met      PT LONG TERM GOAL #2   Title  Pt will increase hip strength to 5/5 MMT all planes. (5 weeks 02/15/18)    Baseline  functional weakness with SLS activities    Status  On-going      PT LONG TERM GOAL #3   Status  New      PT LONG TERM GOAL #4   Title  Pt will be able to perform usual activity and return to jogging at least 2 miles, and walk up/down stairs with less than 2/10 pain overall. (5 weeks 02/15/18)    Status  On-going            Plan - 02/01/18 1246    Clinical Impression Statement  focused on single leg hip strengthening today and eccentric quad strengthening. She demos functional weakness with SLS activiities.     PT Frequency  2x / week    PT Duration  4 weeks    PT Treatment/Interventions  Cryotherapy;Electrical Stimulation;Iontophoresis 9m/ml Dexamethasone;Moist Heat;Ultrasound;Gait training;Stair training;Therapeutic activities;Therapeutic exercise;Neuromuscular re-education;Manual techniques;Passive range of motion;Dry needling;Taping;Joint Manipulations       Patient will benefit from  skilled therapeutic intervention in order to improve the following deficits and impairments:  Decreased activity tolerance, Decreased endurance, Decreased range of motion, Decreased strength, Difficulty walking, Impaired flexibility, Increased fascial restricitons, Pain  Visit Diagnosis: Left knee pain, unspecified chronicity  Right knee pain, unspecified chronicity     Problem List There are no active problems to display for this patient.   JMadelyn FlavorsPT 02/01/2018, 12:49 PM  [address] [address]Phone: [phone]  Fax:  [phone] Name: JLYNORE COSCIAMRN: [id number] of Birth: [birth year]

## 2018-02-03 DIAGNOSIS — Z6820 Body mass index (BMI) 20.0-20.9, adult: Secondary | ICD-10-CM | POA: Diagnosis not present

## 2018-02-03 DIAGNOSIS — Z975 Presence of (intrauterine) contraceptive device: Secondary | ICD-10-CM | POA: Diagnosis not present

## 2018-02-03 DIAGNOSIS — E349 Endocrine disorder, unspecified: Secondary | ICD-10-CM | POA: Diagnosis not present

## 2018-02-04 ENCOUNTER — Ambulatory Visit: Payer: BLUE CROSS/BLUE SHIELD

## 2018-02-07 DIAGNOSIS — E349 Endocrine disorder, unspecified: Secondary | ICD-10-CM | POA: Diagnosis not present

## 2018-02-14 ENCOUNTER — Ambulatory Visit: Payer: BLUE CROSS/BLUE SHIELD | Attending: Student | Admitting: Physical Therapy

## 2018-02-14 DIAGNOSIS — M25562 Pain in left knee: Secondary | ICD-10-CM

## 2018-02-14 DIAGNOSIS — M25561 Pain in right knee: Secondary | ICD-10-CM | POA: Diagnosis not present

## 2018-02-14 NOTE — Therapy (Addendum)
[facility] [place], [place], [address] Phone: [phone]   Fax:  [phone]  Physical Therapy Treatment  Patient Details  Name: Rebecca Martinez MRN: [id number] Date of Birth: October 20, 1970 Referring Provider (PT): Theresa Duty, [place],   Encounter Date: 02/14/2018  PT End of Session - 02/14/18 0803    Visit Number  5    Number of Visits  8    Date for PT Re-Evaluation  02/15/18    Authorization Type  [employer]    PT Start Time  0800    PT Stop Time  0855    PT Time Calculation (min)  55 min    Activity Tolerance  Patient tolerated treatment well    Behavior During Therapy  [place] for tasks assessed/performed       Past Medical History:  Diagnosis Date  . Hypertension     Past Surgical History:  Procedure Laterality Date  . NASAL SINUS SURGERY    . TEMPOROMANDIBULAR JOINT ARTHROPLASTY      There were no vitals filed for this visit.  Subjective Assessment - 02/14/18 0803    Subjective  doing pretty good. Able to ski this weekend without pain.    Diagnostic tests  x-rays show some OA and chondromalcia per pt report    Currently in Pain?  No/denies         [facility] PT Assessment - 02/14/18 0001      AROM   AROM Assessment Site  Knee    Right/Left Knee  Left    Left Knee Flexion  135   118 deg prone     Strength   Overall Strength Comments  Bil hips 5/5                   [facility] Adult PT Treatment/Exercise - 02/14/18 0001      Knee/Hip Exercises: Stretches   Sports administrator  Left;1 rep;20 seconds   prone   Quad Stretch Limitations  pain in knee    Hip Flexor Stretch  1 rep;Left;20 seconds      Knee/Hip Exercises: Aerobic   Elliptical  I15, R5 x 5 min      Knee/Hip Exercises: Standing   Step Down  Both;2 sets;15 reps;Hand Hold: 1;Step Height: 4"    Functional Squat  15 reps   1x10, 1 x5 with 10 sec hold   Functional Squat Limitations  ondecline    Wall Squat  2 sets;10 seconds    x6 bil; x 4 unilaterally   Other Standing Knee Exercises  on bosu: step up opp hip ABD 2x10 bil; step up knee raise 2x10 bil    Other Standing Knee Exercises  single leg dead lift 4# wts 2x10       Modalities   Modalities  Electrical Stimulation;Moist Heat      Moist Heat Therapy   Number Minutes Moist Heat  15 Minutes    Moist Heat Location  Hip      Electrical Stimulation   Electrical Stimulation Location  Bil ITB    Electrical Stimulation Action  premod    Electrical Stimulation Parameters  supine    Electrical Stimulation Goals  Pain      Manual Therapy   Manual Therapy  Soft tissue mobilization;Myofascial release    Soft tissue mobilization  to left quads, TFL    Myofascial Release  ITB stripping  PT Long Term Goals - 02/14/18 5956      PT LONG TERM GOAL #1   Title  Pt will be I and compliant with HEP. (5 weeks 02/15/18)    Baseline  inconsisitent    Status  Partially Met      PT LONG TERM GOAL #2   Title  Pt will increase hip strength to 5/5 MMT all planes. (5 weeks 02/15/18)    Baseline  functional weakness with SLS activities on left    Status  Partially Met      PT LONG TERM GOAL #4   Title  Pt will be able to perform usual activity and return to jogging at least 2 miles, and walk up/down stairs with less than 2/10 pain overall. (5 weeks 02/15/18)    Baseline  no pain with stairs    Status  Partially Met            Plan - 02/14/18 0843    Clinical Impression Statement  Patient is progressing with goals and was able to ski this weekend without pain. She has not attempted running, but has no pain on stairs. She still has palpable knots in her left lateral quad and crepitus in left knee with TE.     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    PT Frequency  2x / week    PT Duration  4 weeks    PT Treatment/Interventions   Cryotherapy;Electrical Stimulation;Iontophoresis 23m/ml Dexamethasone;Moist Heat;Ultrasound;Gait training;Stair training;Therapeutic activities;Therapeutic exercise;Neuromuscular re-education;Manual techniques;Passive range of motion;Dry needling;Taping;Joint Manipulations    PT Next Visit Plan  continue SLS activities, MFR    Consulted and Agree with Plan of Care  Patient       Patient will benefit from skilled therapeutic intervention in order to improve the following deficits and impairments:  Decreased activity tolerance, Decreased endurance, Decreased range of motion, Decreased strength, Difficulty walking, Impaired flexibility, Increased fascial restricitons, Pain  Visit Diagnosis: Left knee pain, unspecified chronicity  Right knee pain, unspecified chronicity     Problem List There are no active problems to display for this patient.  PHYSICAL THERAPY DISCHARGE SUMMARY  Visits from Start of Care: 8  Plan: Patient agrees to discharge.  Patient goals were partially met. Patient is being discharged due to being pleased with the current functional level.  ?????     JMadelyn FlavorsPT 02/14/2018, 8:46 AM  [address] [address]Phone: [phone]  Fax:  [phone] Name: Rebecca PRYCEMRN: [id number] of Birth: [birth year]

## 2018-02-15 DIAGNOSIS — N390 Urinary tract infection, site not specified: Secondary | ICD-10-CM | POA: Diagnosis not present

## 2018-02-15 DIAGNOSIS — E349 Endocrine disorder, unspecified: Secondary | ICD-10-CM | POA: Diagnosis not present

## 2018-02-18 DIAGNOSIS — M25561 Pain in right knee: Secondary | ICD-10-CM | POA: Diagnosis not present

## 2018-02-18 DIAGNOSIS — M25562 Pain in left knee: Secondary | ICD-10-CM | POA: Diagnosis not present

## 2018-02-18 DIAGNOSIS — M2241 Chondromalacia patellae, right knee: Secondary | ICD-10-CM | POA: Diagnosis not present

## 2018-02-18 DIAGNOSIS — M2242 Chondromalacia patellae, left knee: Secondary | ICD-10-CM | POA: Diagnosis not present

## 2018-02-23 DIAGNOSIS — E349 Endocrine disorder, unspecified: Secondary | ICD-10-CM | POA: Diagnosis not present

## 2018-02-23 DIAGNOSIS — Z975 Presence of (intrauterine) contraceptive device: Secondary | ICD-10-CM | POA: Diagnosis not present

## 2018-02-23 DIAGNOSIS — Z6820 Body mass index (BMI) 20.0-20.9, adult: Secondary | ICD-10-CM | POA: Diagnosis not present

## 2018-03-15 DIAGNOSIS — I10 Essential (primary) hypertension: Secondary | ICD-10-CM | POA: Diagnosis not present

## 2018-03-15 DIAGNOSIS — Z0000 Encounter for general adult medical examination without abnormal findings: Secondary | ICD-10-CM | POA: Diagnosis not present

## 2018-03-15 DIAGNOSIS — Z13220 Encounter for screening for lipoid disorders: Secondary | ICD-10-CM | POA: Diagnosis not present

## 2018-03-15 DIAGNOSIS — Z136 Encounter for screening for cardiovascular disorders: Secondary | ICD-10-CM | POA: Diagnosis not present

## 2018-03-30 DIAGNOSIS — Z1231 Encounter for screening mammogram for malignant neoplasm of breast: Secondary | ICD-10-CM | POA: Diagnosis not present

## 2018-05-13 DIAGNOSIS — M79671 Pain in right foot: Secondary | ICD-10-CM | POA: Diagnosis not present

## 2018-05-13 DIAGNOSIS — M71571 Other bursitis, not elsewhere classified, right ankle and foot: Secondary | ICD-10-CM | POA: Diagnosis not present

## 2018-05-13 DIAGNOSIS — M2041 Other hammer toe(s) (acquired), right foot: Secondary | ICD-10-CM | POA: Diagnosis not present

## 2018-09-27 DIAGNOSIS — L821 Other seborrheic keratosis: Secondary | ICD-10-CM | POA: Diagnosis not present

## 2018-09-27 DIAGNOSIS — Z85828 Personal history of other malignant neoplasm of skin: Secondary | ICD-10-CM | POA: Diagnosis not present

## 2018-09-27 DIAGNOSIS — L814 Other melanin hyperpigmentation: Secondary | ICD-10-CM | POA: Diagnosis not present

## 2018-09-27 DIAGNOSIS — D225 Melanocytic nevi of trunk: Secondary | ICD-10-CM | POA: Diagnosis not present

## 2018-10-21 ENCOUNTER — Emergency Department (HOSPITAL_BASED_OUTPATIENT_CLINIC_OR_DEPARTMENT_OTHER)
Admission: EM | Admit: 2018-10-21 | Discharge: 2018-10-21 | Disposition: A | Discharge status: Home / Self Care | Payer: BC Managed Care – PPO | Attending: Emergency Medicine | Admitting: Emergency Medicine

## 2018-10-21 ENCOUNTER — Encounter (HOSPITAL_BASED_OUTPATIENT_CLINIC_OR_DEPARTMENT_OTHER): Payer: Self-pay

## 2018-10-21 ENCOUNTER — Other Ambulatory Visit: Payer: Self-pay

## 2018-10-21 DIAGNOSIS — G43909 Migraine, unspecified, not intractable, without status migrainosus: Secondary | ICD-10-CM | POA: Diagnosis not present

## 2018-10-21 DIAGNOSIS — I10 Essential (primary) hypertension: Secondary | ICD-10-CM | POA: Insufficient documentation

## 2018-10-21 DIAGNOSIS — R112 Nausea with vomiting, unspecified: Secondary | ICD-10-CM | POA: Diagnosis not present

## 2018-10-21 DIAGNOSIS — G43009 Migraine without aura, not intractable, without status migrainosus: Secondary | ICD-10-CM | POA: Diagnosis not present

## 2018-10-21 DIAGNOSIS — R519 Headache, unspecified: Secondary | ICD-10-CM | POA: Diagnosis not present

## 2018-10-21 HISTORY — DX: Migraine, unspecified, not intractable, without status migrainosus: G43.909

## 2018-10-21 MED ORDER — DIPHENHYDRAMINE HCL 50 MG/ML IJ SOLN
25.0000 mg | Freq: Once | INTRAMUSCULAR | Status: AC
  Administered 2018-10-21: 25 mg via INTRAVENOUS
  Filled 2018-10-21: qty 1

## 2018-10-21 MED ORDER — METOCLOPRAMIDE HCL 5 MG/ML IJ SOLN
10.0000 mg | Freq: Once | INTRAMUSCULAR | Status: AC
  Administered 2018-10-21: 22:00:00 10 mg via INTRAVENOUS
  Filled 2018-10-21: qty 2

## 2018-10-21 MED ORDER — KETOROLAC TROMETHAMINE 30 MG/ML IJ SOLN
30.0000 mg | Freq: Once | INTRAMUSCULAR | Status: AC
  Administered 2018-10-21: 30 mg via INTRAVENOUS
  Filled 2018-10-21: qty 1

## 2018-10-21 NOTE — ED Provider Notes (Signed)
Emergency Department Provider Note   I have reviewed the triage vital signs and the nursing notes.   HISTORY  Chief Complaint Migraine   HPI Rebecca Martinez is a 48 y.o. female with PMH of HTN and migraine presents to the emergency department for evaluation of nausea, vomiting, migraine headache symptoms.  This is day 2 of symptoms.  Patient has a remote history of migraines and states this feels similar.  Pain is worse in the left side of her head and face.  No sinus drainage, fever, chills.  No sick contacts.  No sudden onset, maximal intensity headache symptoms.  She no longer follows with neurology is most of her headache symptoms have improved.   Past Medical History:  Diagnosis Date  . Hypertension   . Migraine     There are no active problems to display for this patient.   Past Surgical History:  Procedure Laterality Date  . NASAL SINUS SURGERY    . TEMPOROMANDIBULAR JOINT ARTHROPLASTY      Allergies Shellfish allergy, Codeine, Penicillins, and Other  No family history on file.  Social History Social History   Tobacco Use  . Smoking status: Never Smoker  . Smokeless tobacco: Never Used  Substance Use Topics  . Alcohol use: Yes    Comment: occ  . Drug use: No    Review of Systems  Constitutional: No fever/chills Eyes: No visual changes. ENT: No sore throat. Cardiovascular: Denies chest pain. Respiratory: Denies shortness of breath. Gastrointestinal: No abdominal pain. Positive nausea and vomiting.  No diarrhea.  No constipation. Genitourinary: Negative for dysuria. Musculoskeletal: Negative for back pain. Skin: Negative for rash. Neurological: Negative for focal weakness or numbness. Positive HA.   10-point ROS otherwise negative.  ____________________________________________   PHYSICAL EXAM:  VITAL SIGNS: ED Triage Vitals  Enc Vitals Group     BP 10/21/18 2045 (!) 145/84     Pulse Rate 10/21/18 2045 85     Resp 10/21/18 2045 18    Temp 10/21/18 2045 98.7 F (37.1 C)     Temp Source 10/21/18 2045 Oral     SpO2 10/21/18 2045 100 %     Weight 10/21/18 2045 125 lb (56.7 kg)     Height 10/21/18 2045 5\' 6"  (1.676 m)   Constitutional: Alert and oriented. Well appearing and in no acute distress. Eyes: Conjunctivae are normal. Positive photophobia.  Head: Atraumatic. Nose: No congestion/rhinnorhea. Mouth/Throat: Mucous membranes are moist.  Neck: No stridor.   Cardiovascular: Normal rate, regular rhythm.  Respiratory: Normal respiratory effort.   Gastrointestinal: No distention.  Musculoskeletal: No gross deformities of extremities. Neurologic:  Normal speech and language. No gross focal neurologic deficits are appreciated.  Skin:  Skin is warm, dry and intact. No rash noted.  ____________________________________________   PROCEDURES  Procedure(s) performed:   Procedures  None  ____________________________________________   INITIAL IMPRESSION / ASSESSMENT AND PLAN / ED COURSE  Pertinent labs & imaging results that were available during my care of the patient were reviewed by me and considered in my medical decision making (see chart for details).   Patient presents to the emergency department for evaluation of migraine headache.  She was given migraine cocktail here with provement in symptoms.  Patient is feeling well and ready for discharge home.  Migraine headache is not acutely new or different from her prior.  No concern for SAH, intracerebral lesion, or infectious etiology.  Discussed continued management at home and follow-up with neurology should symptoms become recurrent.  Discussed ED return precautions.  Patient has a ride home.    ____________________________________________  FINAL CLINICAL IMPRESSION(S) / ED DIAGNOSES  Final diagnoses:  Migraine without aura and without status migrainosus, not intractable     MEDICATIONS GIVEN DURING THIS VISIT:  Medications  ketorolac (TORADOL) 30 MG/ML  injection 30 mg (30 mg Intravenous Given 10/21/18 2228)  metoCLOPramide (REGLAN) injection 10 mg (10 mg Intravenous Given 10/21/18 2229)  diphenhydrAMINE (BENADRYL) injection 25 mg (25 mg Intravenous Given 10/21/18 2228)     Note:  This document was prepared using Dragon voice recognition software and may include unintentional dictation errors.  Nanda Quinton, MD, [facility] Emergency Medicine    Bernhard Koskinen, [facility], MD 10/22/18 [phone]

## 2018-10-21 NOTE — Discharge Instructions (Signed)
You have been seen in the Emergency Department (ED) for a migraine.  Please use Tylenol or Motrin as needed for symptoms, but only as written on the box, and take any regular medications that have been prescribed for you. °As we have discussed, please follow up with your doctor as soon as possible regarding today’s ED visit and your headache symptoms.   ° °Call your doctor or return to the Emergency Department (ED) if you have a worsening headache, sudden and severe headache, confusion, slurred speech, facial droop, weakness or numbness in any arm or leg, extreme fatigue, or other symptoms that concern you. ° ° °Migraine Headache °A migraine headache is an intense, throbbing pain on one or both sides of your head. A migraine can last for 30 minutes to several hours. °CAUSES  °The exact cause of a migraine headache is not always known. However, a migraine may be caused when nerves in the brain become irritated and release chemicals that cause inflammation. This causes pain. °Certain things may also trigger migraines, such as: °· Alcohol. °· Smoking. °· Stress. °· Menstruation. °· Aged cheeses. °· Foods or drinks that contain nitrates, glutamate, aspartame, or tyramine. °· Lack of sleep. °· Chocolate. °· Caffeine. °· Hunger. °· Physical exertion. °· Fatigue. °· Medicines used to treat chest pain (nitroglycerine), birth control pills, estrogen, and some blood pressure medicines. °SIGNS AND SYMPTOMS °· Pain on one or both sides of your head. °· Pulsating or throbbing pain. °· Severe pain that prevents daily activities. °· Pain that is aggravated by any physical activity. °· Nausea, vomiting, or both. °· Dizziness. °· Pain with exposure to bright lights, loud noises, or activity. °· General sensitivity to bright lights, loud noises, or smells. °Before you get a migraine, you may get warning signs that a migraine is coming (aura). An aura may include: °· Seeing flashing lights. °· Seeing bright spots, halos, or zigzag  lines. °· Having tunnel vision or blurred vision. °· Having feelings of numbness or tingling. °· Having trouble talking. °· Having muscle weakness. °DIAGNOSIS  °A migraine headache is often diagnosed based on: °· Symptoms. °· Physical exam. °· A CT scan or MRI of your head. These imaging tests cannot diagnose migraines, but they can help rule out other causes of headaches. °TREATMENT °Medicines may be given for pain and nausea. Medicines can also be given to help prevent recurrent migraines.  °HOME CARE INSTRUCTIONS °· Only take over-the-counter or prescription medicines for pain or discomfort as directed by your health care provider. The use of Lehman Whiteley-term narcotics is not recommended. °· Lie down in a dark, quiet room when you have a migraine. °· Keep a journal to find out what may trigger your migraine headaches. For example, write down: °¨ What you eat and drink. °¨ How much sleep you get. °¨ Any change to your diet or medicines. °· Limit alcohol consumption. °· Quit smoking if you smoke. °· Get 7-9 hours of sleep, or as recommended by your health care provider. °· Limit stress. °· Keep lights dim if bright lights bother you and make your migraines worse. °SEEK IMMEDIATE MEDICAL CARE IF:  °· Your migraine becomes severe. °· You have a fever. °· You have a stiff neck. °· You have vision loss. °· You have muscular weakness or loss of muscle control. °· You start losing your balance or have trouble walking. °· You feel faint or pass out. °· You have severe symptoms that are different from your first symptoms. °MAKE SURE YOU:  °·   Understand these instructions. °· Will watch your condition. °· Will get help right away if you are not doing well or get worse. °  °This information is not intended to replace advice given to you by your health care provider. Make sure you discuss any questions you have with your health care provider. °  °Document Released: 12/29/2004 Document Revised: 01/19/2014 Document Reviewed:  09/05/2012 °Elsevier Interactive Patient Education ©2016 [employer] Inc[employer] ° ° ° °

## 2018-10-21 NOTE — ED Triage Notes (Signed)
Pt c/o migraine, n/v day 2-NAD-steady gait

## 2018-12-14 DIAGNOSIS — Z20828 Contact with and (suspected) exposure to other viral communicable diseases: Secondary | ICD-10-CM | POA: Diagnosis not present

## 2019-02-24 ENCOUNTER — Emergency Department (HOSPITAL_BASED_OUTPATIENT_CLINIC_OR_DEPARTMENT_OTHER)
Admission: EM | Admit: 2019-02-24 | Discharge: 2019-02-24 | Disposition: A | Discharge status: Home / Self Care | Payer: BC Managed Care – PPO | Attending: Emergency Medicine | Admitting: Emergency Medicine

## 2019-02-24 ENCOUNTER — Other Ambulatory Visit: Payer: Self-pay

## 2019-02-24 ENCOUNTER — Encounter (HOSPITAL_BASED_OUTPATIENT_CLINIC_OR_DEPARTMENT_OTHER): Payer: Self-pay

## 2019-02-24 DIAGNOSIS — I10 Essential (primary) hypertension: Secondary | ICD-10-CM | POA: Diagnosis not present

## 2019-02-24 DIAGNOSIS — Z885 Allergy status to narcotic agent status: Secondary | ICD-10-CM | POA: Diagnosis not present

## 2019-02-24 DIAGNOSIS — Z880 Allergy status to penicillin: Secondary | ICD-10-CM | POA: Insufficient documentation

## 2019-02-24 DIAGNOSIS — R519 Headache, unspecified: Secondary | ICD-10-CM | POA: Diagnosis not present

## 2019-02-24 DIAGNOSIS — Z79899 Other long term (current) drug therapy: Secondary | ICD-10-CM | POA: Insufficient documentation

## 2019-02-24 DIAGNOSIS — Z7729 Contact with and (suspected ) exposure to other hazardous substances: Secondary | ICD-10-CM

## 2019-02-24 DIAGNOSIS — T5891XA Toxic effect of carbon monoxide from unspecified source, accidental (unintentional), initial encounter: Secondary | ICD-10-CM | POA: Diagnosis not present

## 2019-02-24 DIAGNOSIS — G43909 Migraine, unspecified, not intractable, without status migrainosus: Secondary | ICD-10-CM | POA: Diagnosis not present

## 2019-02-24 DIAGNOSIS — Z91013 Allergy to seafood: Secondary | ICD-10-CM | POA: Diagnosis not present

## 2019-02-24 LAB — BASIC METABOLIC PANEL
Anion gap: 7 (ref 5–15)
BUN: 17 mg/dL (ref 6–20)
CO2: 23 mmol/L (ref 22–32)
Calcium: 8.7 mg/dL — ABNORMAL LOW (ref 8.9–10.3)
Chloride: 104 mmol/L (ref 98–111)
Creatinine, Ser: 0.71 mg/dL (ref 0.44–1.00)
GFR calc Af Amer: >60 mL/min (ref >60)
GFR calc non Af Amer: >60 mL/min (ref >60)
Glucose, Bld: 103 mg/dL — ABNORMAL HIGH (ref 70–99)
Potassium: 3.3 mmol/L — ABNORMAL LOW (ref 3.5–5.1)
Sodium: 134 mmol/L — ABNORMAL LOW (ref 135–145)

## 2019-02-24 LAB — CBC WITH DIFFERENTIAL/PLATELET
Abs Immature Granulocytes: 0.04 10*3/uL (ref 0.00–0.07)
Basophils Absolute: 0 10*3/uL (ref 0.0–0.1)
Basophils Relative: 0 %
Eosinophils Absolute: 0 10*3/uL (ref 0.0–0.5)
Eosinophils Relative: 0 %
HCT: 42.7 % (ref 36.0–46.0)
Hemoglobin: 14.8 g/dL (ref 12.0–15.0)
Immature Granulocytes: 0 %
Lymphocytes Relative: 15 %
Lymphs Abs: 1.4 10*3/uL (ref 0.7–4.0)
MCH: 30.2 pg (ref 26.0–34.0)
MCHC: 34.7 g/dL (ref 30.0–36.0)
MCV: 87.1 fL (ref 80.0–100.0)
Monocytes Absolute: 0.3 10*3/uL (ref 0.1–1.0)
Monocytes Relative: 3 %
Neutro Abs: 7.8 10*3/uL — ABNORMAL HIGH (ref 1.7–7.7)
Neutrophils Relative %: 82 %
Platelets: 231 10*3/uL (ref 150–400)
RBC: 4.9 MIL/uL (ref 3.87–5.11)
RDW: 11.9 % (ref 11.5–15.5)
WBC: 9.6 10*3/uL (ref 4.0–10.5)
nRBC: 0 % (ref 0.0–0.2)

## 2019-02-24 MED ORDER — SODIUM CHLORIDE 0.9 % IV BOLUS
1000.0000 mL | Freq: Once | INTRAVENOUS | Status: AC
  Administered 2019-02-24: 18:00:00 1000 mL via INTRAVENOUS

## 2019-02-24 MED ORDER — KETOROLAC TROMETHAMINE 30 MG/ML IJ SOLN
30.0000 mg | Freq: Once | INTRAMUSCULAR | Status: AC
  Administered 2019-02-24: 18:00:00 30 mg via INTRAVENOUS
  Filled 2019-02-24: qty 1

## 2019-02-24 MED ORDER — ONDANSETRON HCL 4 MG/2ML IJ SOLN
4.0000 mg | Freq: Once | INTRAMUSCULAR | Status: AC
  Administered 2019-02-24: 4 mg via INTRAVENOUS
  Filled 2019-02-24: qty 2

## 2019-02-24 NOTE — ED Provider Notes (Signed)
[facility] EMERGENCY DEPARTMENT Provider Note   CSN: [id number] Arrival date & time: 02/24/19  1659     History Chief Complaint  Patient presents with  . Headache    Rebecca Martinez is a 49 y.o. female.  HPI Patient reports that she discovered today that she has high carbon monoxide levels in her home.  She reports she thought she had smelled gas intermittently but today called [employer] and they came and checked the house.  She reports they said that the carbon oxide level was high but she does not know the value.  She reports she was told that one of the vents on the roof had gotten bent and was putting exhaust back in the house.  She reports that she moved into this house in November and has been having problems with headaches and fatigue since living there.  He has been working from home so she just attributed it  to work and fatigue.  She states frequently she has been having problems with a aching, left-sided headache.  Today, after she left the house and went to her sister's house, she vomited forcefully several times.  She reports that she feels better now.  She reports headache is mild.  She no longer has feeling of intense nausea.  Patient reports that she went skiing 4 days last week end.  She reports she felt great and all of the symptoms of fatigue, nausea and headache resolved while she was away from her home last weekend.    Past Medical History:  Diagnosis Date  . Hypertension   . Migraine     There are no problems to display for this patient.   Past Surgical History:  Procedure Laterality Date  . NASAL SINUS SURGERY    . TEMPOROMANDIBULAR JOINT ARTHROPLASTY       OB History   No obstetric history on file.     No family history on file.  Social History   Tobacco Use  . Smoking status: Never Smoker  . Smokeless tobacco: Never Used  Substance Use Topics  . Alcohol use: Yes    Comment: occ  . Drug use: No    Home Medications Prior to  Admission medications   Medication Sig Start Date End Date Taking? Authorizing Provider  acetaminophen (TYLENOL) 325 MG tablet Take 650 mg by mouth every 6 (six) hours as needed for pain.    [provider]  EPINEPHrine (EPI-PEN) 0.3 mg/0.3 mL SOAJ injection Inject 0.3 mg into the muscle once.    [provider]  hydrochlorothiazide (MICROZIDE) 12.5 MG capsule Take 12.5 mg by mouth daily.    [provider]  ibuprofen (ADVIL,MOTRIN) 200 MG tablet Take 400 mg by mouth every 6 (six) hours as needed for pain.    [provider]  levonorgestrel (MIRENA) 20 MCG/24HR IUD 1 each by Intrauterine route once.    [provider]  omeprazole (PRILOSEC) 40 MG capsule Take 1 capsule (40 mg total) by mouth daily. 09/05/12   Lucious Groves, DO  ondansetron (ZOFRAN ODT) 4 MG disintegrating tablet Take 1 tablet (4 mg total) by mouth every 8 (eight) hours as needed for nausea or vomiting. 12/07/14   Carlisle Cater, PA-C    Allergies    Shellfish allergy, Codeine, Penicillins, and Other  Review of Systems   Review of Systems 10 Systems reviewed and are negative for acute change except as noted in the HPI.  Physical Exam Updated Vital Signs BP (!) 161/97 (  BP Location: Left Arm)   Pulse 76   Temp 98.7 F (37.1 C) (Oral)   Resp 18   Ht 5\' 6"  (1.676 m)   Wt 60.3 kg   SpO2 99%   BMI 21.47 kg/m   Physical Exam Constitutional:      Appearance: Normal appearance.  HENT:     Head: Normocephalic and atraumatic.     Mouth/Throat:     Mouth: Mucous membranes are moist.     Pharynx: Oropharynx is clear.  Eyes:     Extraocular Movements: Extraocular movements intact.     Conjunctiva/sclera: Conjunctivae normal.     Pupils: Pupils are equal, round, and reactive to light.  Cardiovascular:     Rate and Rhythm: Normal rate and regular rhythm.     Pulses: Normal pulses.     Heart sounds: Normal heart sounds.  Pulmonary:     Effort: Pulmonary effort is normal.      Breath sounds: Normal breath sounds.  Abdominal:     General: There is no distension.     Palpations: Abdomen is soft.     Tenderness: There is no abdominal tenderness. There is no guarding.  Musculoskeletal:        General: No swelling or tenderness. Normal range of motion.     Cervical back: Neck supple.     Right lower leg: No edema.     Left lower leg: No edema.  Skin:    General: Skin is warm and dry.  Neurological:     General: No focal deficit present.     Mental Status: She is alert and oriented to person, place, and time.     Cranial Nerves: No cranial nerve deficit.     Coordination: Coordination normal.  Psychiatric:        Mood and Affect: Mood normal.     ED Results / Procedures / Treatments   Labs (all labs ordered are listed, but only abnormal results are displayed) Labs Reviewed  CBC WITH DIFFERENTIAL/PLATELET - Abnormal; Notable for the following components:      Result Value   Neutro Abs 7.8 (*)    All other components within normal limits  BASIC METABOLIC PANEL - Abnormal; Notable for the following components:   Sodium 134 (*)    Potassium 3.3 (*)    Glucose, Bld 103 (*)    Calcium 8.7 (*)    All other components within normal limits    EKG None  Radiology No results found.  Procedures Procedures (including critical care time)  Medications Ordered in ED Medications  sodium chloride 0.9 % bolus 1,000 mL (1,000 mLs Intravenous New Bag/Given 02/24/19 1800)  ketorolac (TORADOL) 30 MG/ML injection 30 mg (30 mg Intravenous Given 02/24/19 1801)  ondansetron (ZOFRAN) injection 4 mg (4 mg Intravenous Given 02/24/19 1800)    ED Course  I have reviewed the triage vital signs and the nursing notes.  Pertinent labs & imaging results that were available during my care of the patient were reviewed by me and considered in my medical decision making (see chart for details).    MDM Rules/Calculators/A&P                     Carboxyhemoglobin monitor shows  reading less than 1.  Patient has baseline mild hypokalemia.  She reports he is compliant with taking potassium as prescribed.  CBC otherwise within normal limits.  Patient describes experiencing chronic low-grade symptoms of carbon monoxide exposure.  It was discovered today  that the level in her house was high.  She reports last weekend, when she was away from home for 4 days she felt very good and all symptoms resolved.  Patient is mental status is clear.  She feels much improved after 1 L of hydration and Toradol and Zofran.  She is stable for discharge.  She is not going back to her home until this problem is resolved.  Return precautions reviewed.  Patient discharged in good condition.   Final Clinical Impression(s) / ED Diagnoses Final diagnoses:  Bad headache  Carbon monoxide exposure    Rx / DC Orders ED Discharge Orders    None       Arby Barrette, MD 02/24/19 1914

## 2019-02-24 NOTE — ED Triage Notes (Signed)
Pt c/o HA, n/v x 1 month-states she found out today she had carbon monoxide exposure in her home-NAD-steady gait

## 2019-02-24 NOTE — Discharge Instructions (Signed)
1.  Schedule recheck with your doctor within the next 3 to 5 days. 2.  Return to the emergency department if you have worsening or changing symptoms.  You should start to feel much improved within the next day or 2.

## 2019-04-07 DIAGNOSIS — E559 Vitamin D deficiency, unspecified: Secondary | ICD-10-CM | POA: Diagnosis not present

## 2019-04-07 DIAGNOSIS — Z13220 Encounter for screening for lipoid disorders: Secondary | ICD-10-CM | POA: Diagnosis not present

## 2019-04-07 DIAGNOSIS — Z0000 Encounter for general adult medical examination without abnormal findings: Secondary | ICD-10-CM | POA: Diagnosis not present

## 2019-04-07 DIAGNOSIS — I10 Essential (primary) hypertension: Secondary | ICD-10-CM | POA: Diagnosis not present

## 2019-04-11 DIAGNOSIS — Z1231 Encounter for screening mammogram for malignant neoplasm of breast: Secondary | ICD-10-CM | POA: Diagnosis not present

## 2019-04-20 DIAGNOSIS — R922 Inconclusive mammogram: Secondary | ICD-10-CM | POA: Diagnosis not present

## 2019-04-20 DIAGNOSIS — N6011 Diffuse cystic mastopathy of right breast: Secondary | ICD-10-CM | POA: Diagnosis not present

## 2019-04-21 DIAGNOSIS — Z6820 Body mass index (BMI) 20.0-20.9, adult: Secondary | ICD-10-CM | POA: Diagnosis not present

## 2019-04-21 DIAGNOSIS — Z01419 Encounter for gynecological examination (general) (routine) without abnormal findings: Secondary | ICD-10-CM | POA: Diagnosis not present

## 2019-04-21 DIAGNOSIS — Z1151 Encounter for screening for human papillomavirus (HPV): Secondary | ICD-10-CM | POA: Diagnosis not present

## 2019-04-27 DIAGNOSIS — N6312 Unspecified lump in the right breast, upper inner quadrant: Secondary | ICD-10-CM | POA: Diagnosis not present

## 2019-05-05 ENCOUNTER — Ambulatory Visit: Payer: Self-pay | Admitting: Allergy

## 2019-05-28 DIAGNOSIS — Z880 Allergy status to penicillin: Secondary | ICD-10-CM | POA: Diagnosis not present

## 2019-05-28 DIAGNOSIS — G43109 Migraine with aura, not intractable, without status migrainosus: Secondary | ICD-10-CM | POA: Diagnosis not present

## 2019-05-28 DIAGNOSIS — R519 Headache, unspecified: Secondary | ICD-10-CM | POA: Diagnosis not present

## 2019-05-28 DIAGNOSIS — Z885 Allergy status to narcotic agent status: Secondary | ICD-10-CM | POA: Diagnosis not present

## 2019-05-28 DIAGNOSIS — H53149 Visual discomfort, unspecified: Secondary | ICD-10-CM | POA: Diagnosis not present

## 2019-05-28 DIAGNOSIS — R110 Nausea: Secondary | ICD-10-CM | POA: Diagnosis not present

## 2019-06-01 DIAGNOSIS — I10 Essential (primary) hypertension: Secondary | ICD-10-CM | POA: Diagnosis not present

## 2019-06-15 NOTE — Progress Notes (Signed)
New Patient Note  RE: Rebecca Martinez MRN: [id number] DOB: [birth year] Date of Office Visit: 06/16/2019  Referring provider: Shirlean Mylar, MD Primary care provider: Shirlean Mylar, MD  Chief Complaint: Allergies  History of Present Illness: I had the pleasure of seeing Rebecca Martinez for initial evaluation [facility]  on 06/16/2019. She is a 49 y.o. female, who is referred here by Shirlean Mylar, MD for the evaluation of allergies. Patient was seen in our clinic more than 5 years for allergic rhinitis and was on AIT.  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   Shrimp:  The reaction occurred in her 56s, after she ate some type of shrimp dish at a restaurant. Symptoms started within minutes and was in the form of vomiting, hives. Denies any associated cofactors such as exertion, infection, NSAID use. The symptoms lasted for less than 1 day. She was not evaluated in ED. She does have access to epinephrine autoinjector and not needed to use it.   Milk: Milk causes facial rash. Last episode was as a teenager.  She can eat some limited amounts of yogurt and cheese with no issues.   Alcohol: Patient noticed after drinking wine or vodka seems to trigger her migraines. She was not aware that alcohol is a migraine trigger.   Past work up includes: 2006 skin testing was positive to milk, shellfish mix and fish mix, carrots, celery, shrimp, tuna, cantaloupe, watermelon, lobster and salmon. Dietary History: patient has been eating other foods including eggs, peanut, treenuts, sesame, seafood - mahi, flounder; soy, wheat, meats, fruits and vegetables.  She reports reading labels and avoiding shrimp, straight milk in diet completely.  Assessment and Plan: Kelvin is a 49 y.o. female with: Adverse food reaction Patient is concerned whether she developed new food allergies. Reaction to shrimp in her 5s. Tolerates finned fish. Limited straight milk as it causes  facial rash but tolerates other forms of limited dairy such as yogurt and cheese with no issues. 2006 skin testing was positive to milk, shellfish mix and fish mix, carrots, celery, shrimp, tuna, cantaloupe, watermelon, lobster and salmon.  Today's skin testing showed: Borderline positive to barley, tomato and cabbage. Results given.   Food allergen skin testing has excellent negative predictive value however there is still a 5% chance that the allergy exists. Therefore, we will investigate further with serum specific IgE levels and, if negative then schedule for open graded oral food challenge.  No change in diet.  Continue to avoid shellfish and straight milk for now.   See if you notice any symptoms after eating barley, tomatoes or cabbage.  Discussed with patient that alcohol is migraine trigger which she was not aware of and gave her a handout out commons foods that can trigger migraines/headaches.  Other allergic rhinitis 2006 skin testing was positive to grass, weed, ragweed, trees, dust mites, cat. Patient was on allergy immunotherapy with good benefit and currently is asymptomatic.   May use over the counter antihistamines such as Zyrtec (cetirizine), Claritin (loratadine), Allegra (fexofenadine), or Xyzal (levocetirizine) daily as needed.  History of asthma History of exercise induced asthma and no inhaler use recently however she is going to compete in a 50K run with obstacle course and would like an albuterol sent in just in case.  May use albuterol rescue inhaler 2 puffs every 4 to 6 hours as needed for shortness of breath, chest tightness, coughing, and wheezing. May use albuterol rescue inhaler 2 puffs  5 to 15 minutes prior to strenuous physical activities. Monitor frequency of use.   Return in about 1 year (around 06/15/2020).  Meds ordered this encounter  Medications  . albuterol (VENTOLIN HFA) 108 (90 Base) MCG/ACT inhaler    Sig: Inhale 2 puffs into the lungs every 4  (four) hours as needed for wheezing or shortness of breath (coughing).    Dispense:  18 g    Refill:  1    Lab Orders     IgE Milk w/ Component Reflex     Allergen Profile, Food-Fish     Allergen Profile, Shellfish  Other allergy screening: Asthma: no  Patient used to have inhaler in college for exercise induced asthma Rhino conjunctivitis: yes  Well controlled now since she had allergy injections.  2006 skin testing was positive to grass, weed, ragweed, trees, dust mites, cat. Medication allergy: yes  Penicillin - rash Codeine - nausea  Hymenoptera allergy: no Urticaria: no Eczema: yes History of recurrent infections suggestive of immunodeficency: no  Diagnostics:  Skin Testing: Food allergy panel. Borderline positive to barley, tomato and cabbage. Results discussed with patient/family. Food Adult Perc - 06/16/19 0900    Time Antigen Placed  0539    Allergen Manufacturer  Lavella Hammock    Location  Back    Number of allergen test  72     Control-buffer 50% Glycerol  Negative    Control-Histamine 1 mg/ml  2+    1. Peanut  Negative    2. Soybean  Negative    3. Wheat  Negative    4. Sesame  Negative    5. Milk, cow  Negative    6. Egg White, Chicken  Negative    7. Casein  Negative    8. Shellfish Mix  Negative    9. Fish Mix  Negative    10. Cashew  Negative    11. Pecan Food  Negative    12. [place]  Negative    13. Almond  Negative    14. Hazelnut  Negative    15. [place] nut  Negative    16. Coconut  Negative    17. Pistachio  Negative    18. Catfish  Negative    19. Bass  Negative    20. Trout  Negative    21. Tuna  Negative    22. Salmon  Negative    23. Flounder  Negative    24. Codfish  Negative    25. Shrimp  Negative    26. Crab  Negative    27. Lobster  Negative    28. Oyster  Negative    29. Scallops  Negative    30. Barley  --   +/-   31. Oat   Negative    32. Rye   Negative    33. Hops  Negative    34. Rice  Negative    35. Cottonseed   Negative    36. Saccharomyces Cerevisiae   Negative    37. Pork  Negative    38. [place] Meat  Negative    39. Chicken Meat  Negative    40. Beef  Negative    41. Lamb  Negative    42. Tomato  --   +/-   43. White Potato  Negative    44. Sweet Potato  Negative    45. Pea, Green/English  Negative    46. Navy Bean  Negative    47. Mushrooms  Negative  48. Avocado  Negative    49. Onion  Negative    50. Cabbage  --   +/-   51. Carrots  Negative    52. Celery  Negative    53. Corn  Negative    54. Cucumber  Negative    55. Grape (White seedless)  Negative    56. Orange   Negative    57. Banana  Negative    58. Apple  Negative    59. Peach  Negative    60. Strawberry  Negative    61. Cantaloupe  Negative    62. Watermelon  Negative    63. Pineapple  Negative    64. Chocolate/Cacao bean  Negative    65. Karaya Gum  Negative    66. Acacia (Arabic Gum)  Negative    67. Cinnamon  Negative    68. Nutmeg  Negative    69. Ginger  Negative    70. Garlic  Negative    71. Pepper, black  Negative    72. Mustard  Negative       Past Medical History: Patient Active Problem List   Diagnosis Date Noted  . Adverse food reaction 06/16/2019  . Other allergic rhinitis 06/16/2019  . History of asthma 06/16/2019   Past Medical History:  Diagnosis Date  . Eczema   . Hypertension   . Migraine    Past Surgical History: Past Surgical History:  Procedure Laterality Date  . NASAL SINUS SURGERY    . TEMPOROMANDIBULAR JOINT ARTHROPLASTY     Medication List:  Current Outpatient Medications  Medication Sig Dispense Refill  . acetaminophen (TYLENOL) 325 MG tablet Take 650 mg by mouth every 6 (six) hours as needed for pain.    Marland Kitchen EPINEPHrine (EPI-PEN) 0.3 mg/0.3 mL SOAJ injection Inject 0.3 mg into the muscle once.    . hydrochlorothiazide (MICROZIDE) 12.5 MG capsule Take 12.5 mg by mouth daily.    Marland Kitchen ibuprofen (ADVIL,MOTRIN) 200 MG tablet Take 400 mg by mouth every 6 (six) hours as  needed for pain.    Marland Kitchen levonorgestrel (MIRENA) 20 MCG/24HR IUD 1 each by Intrauterine route once.    . ondansetron (ZOFRAN ODT) 4 MG disintegrating tablet Take 1 tablet (4 mg total) by mouth every 8 (eight) hours as needed for nausea or vomiting. 10 tablet 0  . albuterol (VENTOLIN HFA) 108 (90 Base) MCG/ACT inhaler Inhale 2 puffs into the lungs every 4 (four) hours as needed for wheezing or shortness of breath (coughing). 18 g 1   No current facility-administered medications for this visit.   Allergies: Allergies  Allergen Reactions  . Shellfish Allergy Nausea And Vomiting  . Codeine Nausea Only  . Penicillins Other (See Comments)    unknown  . Milk-Related Compounds Rash  . Other Rash    Some fruits   Social History: Social History   Socioeconomic History  . Marital status: Married    Spouse name: Not on file  . Number of children: Not on file  . Years of education: Not on file  . Highest education level: Not on file  Occupational History  . Not on file  Tobacco Use  . Smoking status: Never Smoker  . Smokeless tobacco: Never Used  Substance and Sexual Activity  . Alcohol use: Yes    Comment: occ  . Drug use: No  . Sexual activity: Not on file  Other Topics Concern  . Not on file  Social History Narrative  . Not on file  Social Determinants of Health   Financial Resource Strain:   . Difficulty of Paying Living Expenses:   Food Insecurity:   . Worried About Programme researcher, broadcasting/film/video in the Last Year:   . Barista in the Last Year:   Transportation Needs:   . Freight forwarder (Medical):   Marland Kitchen Lack of Transportation (Non-Medical):   Physical Activity:   . Days of Exercise per Week:   . Minutes of Exercise per Session:   Stress:   . Feeling of Stress :   Social Connections:   . Frequency of Communication with Friends and Family:   . Frequency of Social Gatherings with Friends and Family:   . Attends Religious Services:   . Active Member of Clubs or  Organizations:   . Attends Banker Meetings:   Marland Kitchen Marital Status:    Lives in a 53+ year old home. Smoking: denies Occupation: Banker HistorySurveyor, minerals in the house: no Engineer, civil (consulting) in the family room: no Carpet in the bedroom: no Heating: gas Cooling: central Pet: yes 1 dog x 3 yrs  Family History: Family History  Problem Relation Age of Onset  . Allergic rhinitis Mother   . Allergic rhinitis Son   . Allergic rhinitis Daughter   . Allergic rhinitis Daughter   . Immunodeficiency Neg Hx   . Urticaria Neg Hx   . Eczema Neg Hx   . Angioedema Neg Hx   . Asthma Neg Hx   . Atopy Neg Hx     Review of Systems  Constitutional: Negative for appetite change, chills, fever and unexpected weight change.  HENT: Negative for congestion and rhinorrhea.   Eyes: Negative for itching.  Respiratory: Negative for cough, chest tightness, shortness of breath and wheezing.   Cardiovascular: Negative for chest pain.  Gastrointestinal: Negative for abdominal pain.  Genitourinary: Negative for difficulty urinating.  Skin: Negative for rash.  Neurological: Positive for headaches.   Objective: BP 128/72   Pulse 70   Temp 97.9 F (36.6 C) (Oral)   Resp 18   Ht 5' 6.5" (1.689 m)   Wt 133 lb (60.3 kg)   SpO2 99%   BMI 21.15 kg/m  Body mass index is 21.15 kg/m. Physical Exam  Constitutional: She is oriented to person, place, and time. She appears well-developed and well-nourished.  HENT:  Head: Normocephalic and atraumatic.  Right Ear: External ear normal.  Left Ear: External ear normal.  Nose: Nose normal.  Mouth/Throat: Oropharynx is clear and moist.  Eyes: Conjunctivae and EOM are normal.  Cardiovascular: Normal rate, regular rhythm and normal heart sounds. Exam reveals no gallop and no friction rub.  No murmur heard. Pulmonary/Chest: Effort normal and breath sounds normal. She has no wheezes. She has no rales.  Abdominal: Soft.    Musculoskeletal:     Cervical back: Neck supple.  Neurological: She is alert and oriented to person, place, and time.  Skin: Skin is warm. No rash noted.  Psychiatric: She has a normal mood and affect. Her behavior is normal.  Nursing note and vitals reviewed.  The plan was reviewed with the patient/family, and all questions/concerned were addressed.  It was my pleasure to see Akirah today and participate in her care. Please feel free to contact me with any questions or concerns.  Sincerely,  Wyline Mood, DO Allergy & Immunology  [facility] office: [phone] [facility] office: [phone] [place] office: [phone]

## 2019-06-16 ENCOUNTER — Other Ambulatory Visit: Payer: Self-pay

## 2019-06-16 ENCOUNTER — Encounter: Payer: Self-pay | Admitting: Allergy

## 2019-06-16 ENCOUNTER — Ambulatory Visit: Payer: BC Managed Care – PPO | Admitting: Allergy

## 2019-06-16 VITALS — BP 128/72 | HR 70 | Temp 97.9°F | Resp 18 | Ht 66.5 in | Wt 133.0 lb

## 2019-06-16 DIAGNOSIS — T781XXD Other adverse food reactions, not elsewhere classified, subsequent encounter: Secondary | ICD-10-CM | POA: Diagnosis not present

## 2019-06-16 DIAGNOSIS — Z8709 Personal history of other diseases of the respiratory system: Secondary | ICD-10-CM

## 2019-06-16 DIAGNOSIS — J3089 Other allergic rhinitis: Secondary | ICD-10-CM | POA: Insufficient documentation

## 2019-06-16 DIAGNOSIS — T781XXA Other adverse food reactions, not elsewhere classified, initial encounter: Secondary | ICD-10-CM | POA: Insufficient documentation

## 2019-06-16 MED ORDER — ALBUTEROL SULFATE HFA 108 (90 BASE) MCG/ACT IN AERS: 2.0000 | INHALATION_SPRAY | RESPIRATORY_TRACT | 1 refills | Status: AC | PRN

## 2019-06-16 NOTE — Assessment & Plan Note (Signed)
2006 skin testing was positive to grass, weed, ragweed, trees, dust mites, cat. Patient was on allergy immunotherapy with good benefit and currently is asymptomatic.   May use over the counter antihistamines such as Zyrtec (cetirizine), Claritin (loratadine), Allegra (fexofenadine), or Xyzal (levocetirizine) daily as needed.

## 2019-06-16 NOTE — Patient Instructions (Addendum)
Today's skin testing showed: Borderline positive to barley, tomato and cabbage. Results given.    Food allergen skin testing has excellent negative predictive value however there is still a 5% chance that the allergy exists. Therefore, we will investigate further with serum specific IgE levels and, if negative then schedule for open graded oral food challenge.   No change in diet.  Continue to avoid shellfish and straight milk for now.   See if you notice any symptoms after eating barley, tomatoes or cabbage.   Avoid foods that trigger your migraine - separate list given.   Breathing:   May use albuterol rescue inhaler 2 puffs every 4 to 6 hours as needed for shortness of breath, chest tightness, coughing, and wheezing. May use albuterol rescue inhaler 2 puffs 5 to 15 minutes prior to strenuous physical activities. Monitor frequency of use.   Environmental allergies:  May use over the counter antihistamines such as Zyrtec (cetirizine), Claritin (loratadine), Allegra (fexofenadine), or Xyzal (levocetirizine) daily as needed.  Follow up in 1 year or sooner if needed.

## 2019-06-16 NOTE — Assessment & Plan Note (Signed)
Patient is concerned whether she developed new food allergies. Reaction to shrimp in her 13s. Tolerates finned fish. Limited straight milk as it causes facial rash but tolerates other forms of limited dairy such as yogurt and cheese with no issues. 2006 skin testing was positive to milk, shellfish mix and fish mix, carrots, celery, shrimp, tuna, cantaloupe, watermelon, lobster and salmon.  Today's skin testing showed: Borderline positive to barley, tomato and cabbage. Results given.   Food allergen skin testing has excellent negative predictive value however there is still a 5% chance that the allergy exists. Therefore, we will investigate further with serum specific IgE levels and, if negative then schedule for open graded oral food challenge.  No change in diet.  Continue to avoid shellfish and straight milk for now.   See if you notice any symptoms after eating barley, tomatoes or cabbage.  Discussed with patient that alcohol is migraine trigger which she was not aware of and gave her a handout out commons foods that can trigger migraines/headaches.

## 2019-06-16 NOTE — Assessment & Plan Note (Signed)
History of exercise induced asthma and no inhaler use recently however she is going to compete in a 50K run with obstacle course and would like an albuterol sent in just in case.  May use albuterol rescue inhaler 2 puffs every 4 to 6 hours as needed for shortness of breath, chest tightness, coughing, and wheezing. May use albuterol rescue inhaler 2 puffs 5 to 15 minutes prior to strenuous physical activities. Monitor frequency of use.

## 2019-06-20 DIAGNOSIS — I10 Essential (primary) hypertension: Secondary | ICD-10-CM | POA: Diagnosis not present

## 2019-06-21 LAB — ALLERGEN PROFILE, FOOD-FISH
Allergen Mackerel IgE: <0.1 kU/L
Allergen Salmon IgE: <0.1 kU/L
Allergen Trout IgE: <0.1 kU/L
Allergen Walley Pike IgE: <0.1 kU/L
Codfish IgE: <0.1 kU/L
Halibut IgE: <0.1 kU/L
Tuna: <0.1 kU/L

## 2019-06-21 LAB — ALLERGEN PROFILE, SHELLFISH
Clam IgE: <0.1 kU/L
F023-IgE Crab: <0.1 kU/L
F080-IgE Lobster: <0.1 kU/L
F290-IgE Oyster: <0.1 kU/L
Scallop IgE: <0.1 kU/L
Shrimp IgE: <0.1 kU/L

## 2019-06-21 LAB — IGE MILK W/ COMPONENT REFLEX: F002-IgE Milk: <0.1 kU/L

## 2019-07-12 DIAGNOSIS — H2513 Age-related nuclear cataract, bilateral: Secondary | ICD-10-CM | POA: Diagnosis not present

## 2019-07-12 DIAGNOSIS — H353131 Nonexudative age-related macular degeneration, bilateral, early dry stage: Secondary | ICD-10-CM | POA: Diagnosis not present

## 2019-08-02 ENCOUNTER — Encounter: Payer: Self-pay | Admitting: Neurology

## 2019-08-07 DIAGNOSIS — M9904 Segmental and somatic dysfunction of sacral region: Secondary | ICD-10-CM | POA: Diagnosis not present

## 2019-08-07 DIAGNOSIS — M9905 Segmental and somatic dysfunction of pelvic region: Secondary | ICD-10-CM | POA: Diagnosis not present

## 2019-08-07 DIAGNOSIS — M9903 Segmental and somatic dysfunction of lumbar region: Secondary | ICD-10-CM | POA: Diagnosis not present

## 2019-08-07 DIAGNOSIS — M7918 Myalgia, other site: Secondary | ICD-10-CM | POA: Diagnosis not present

## 2019-08-11 DIAGNOSIS — M9905 Segmental and somatic dysfunction of pelvic region: Secondary | ICD-10-CM | POA: Diagnosis not present

## 2019-08-11 DIAGNOSIS — M9903 Segmental and somatic dysfunction of lumbar region: Secondary | ICD-10-CM | POA: Diagnosis not present

## 2019-08-11 DIAGNOSIS — M7918 Myalgia, other site: Secondary | ICD-10-CM | POA: Diagnosis not present

## 2019-08-11 DIAGNOSIS — M9904 Segmental and somatic dysfunction of sacral region: Secondary | ICD-10-CM | POA: Diagnosis not present

## 2019-08-16 DIAGNOSIS — M9904 Segmental and somatic dysfunction of sacral region: Secondary | ICD-10-CM | POA: Diagnosis not present

## 2019-08-16 DIAGNOSIS — M9905 Segmental and somatic dysfunction of pelvic region: Secondary | ICD-10-CM | POA: Diagnosis not present

## 2019-08-16 DIAGNOSIS — M9903 Segmental and somatic dysfunction of lumbar region: Secondary | ICD-10-CM | POA: Diagnosis not present

## 2019-08-16 DIAGNOSIS — M7918 Myalgia, other site: Secondary | ICD-10-CM | POA: Diagnosis not present

## 2019-08-21 DIAGNOSIS — M9905 Segmental and somatic dysfunction of pelvic region: Secondary | ICD-10-CM | POA: Diagnosis not present

## 2019-08-21 DIAGNOSIS — M9904 Segmental and somatic dysfunction of sacral region: Secondary | ICD-10-CM | POA: Diagnosis not present

## 2019-08-21 DIAGNOSIS — M7918 Myalgia, other site: Secondary | ICD-10-CM | POA: Diagnosis not present

## 2019-08-21 DIAGNOSIS — M9903 Segmental and somatic dysfunction of lumbar region: Secondary | ICD-10-CM | POA: Diagnosis not present

## 2019-08-27 ENCOUNTER — Emergency Department (HOSPITAL_BASED_OUTPATIENT_CLINIC_OR_DEPARTMENT_OTHER)
Admission: EM | Admit: 2019-08-27 | Discharge: 2019-08-27 | Disposition: A | Discharge status: Home / Self Care | Payer: BC Managed Care – PPO | Attending: Emergency Medicine | Admitting: Emergency Medicine

## 2019-08-27 ENCOUNTER — Encounter (HOSPITAL_BASED_OUTPATIENT_CLINIC_OR_DEPARTMENT_OTHER): Payer: Self-pay | Admitting: Emergency Medicine

## 2019-08-27 ENCOUNTER — Other Ambulatory Visit: Payer: Self-pay

## 2019-08-27 DIAGNOSIS — J45909 Unspecified asthma, uncomplicated: Secondary | ICD-10-CM | POA: Diagnosis not present

## 2019-08-27 DIAGNOSIS — G43809 Other migraine, not intractable, without status migrainosus: Secondary | ICD-10-CM | POA: Diagnosis not present

## 2019-08-27 DIAGNOSIS — Z79899 Other long term (current) drug therapy: Secondary | ICD-10-CM | POA: Insufficient documentation

## 2019-08-27 DIAGNOSIS — H53149 Visual discomfort, unspecified: Secondary | ICD-10-CM | POA: Insufficient documentation

## 2019-08-27 DIAGNOSIS — I10 Essential (primary) hypertension: Secondary | ICD-10-CM | POA: Insufficient documentation

## 2019-08-27 DIAGNOSIS — G43909 Migraine, unspecified, not intractable, without status migrainosus: Secondary | ICD-10-CM | POA: Diagnosis not present

## 2019-08-27 MED ORDER — SODIUM CHLORIDE 0.9 % IV BOLUS
1000.0000 mL | Freq: Once | INTRAVENOUS | Status: AC
  Administered 2019-08-27: 1000 mL via INTRAVENOUS

## 2019-08-27 MED ORDER — DIPHENHYDRAMINE HCL 50 MG/ML IJ SOLN
25.0000 mg | Freq: Once | INTRAMUSCULAR | Status: AC
  Administered 2019-08-27: 25 mg via INTRAVENOUS
  Filled 2019-08-27: qty 1

## 2019-08-27 MED ORDER — PROCHLORPERAZINE EDISYLATE 10 MG/2ML IJ SOLN
10.0000 mg | Freq: Once | INTRAMUSCULAR | Status: AC
  Administered 2019-08-27: 10 mg via INTRAVENOUS
  Filled 2019-08-27: qty 2

## 2019-08-27 NOTE — Medical Student Note (Signed)
[facility] Provider Student Note For educational purposes for Medical, PA and NP students only and not part of the legal medical record.   CSN: [id number] Arrival date & time: 08/27/19  1829      History   Chief Complaint Chief Complaint  Patient presents with  . Migraine    HPI Rebecca Martinez is a 49 y.o. female with PMH of migraines and HTN presents to the ED for migraine. She says this headache began last night and has been constant since then. Says the headache is frontal and mostly on the left side frontal area. Says the headache doesn't radiate anywhere. She reports associated vomiting that started last night and last episode of vomiting was this morning- she reports taking zofran at home and drinking water with it and immediately vomiting her zofran back up. She says that she does vomit at times with her migraines in the past. Denies visual disturbances or lack of vision, denies loss of hearing or hearing changes. Admits to photophobia. Reports that she sometimes takes ibuprofen at home for headaches but hasn't tried any for this occurrence. Says usually takes phenergan and zofran at home for migraines. Has apt with neurologist in October due to increased number of headache days.    HPI  Past Medical History:  Diagnosis Date  . Eczema   . Hypertension   . Migraine     Patient Active Problem List   Diagnosis Date Noted  . Adverse food reaction 06/16/2019  . Other allergic rhinitis 06/16/2019  . History of asthma 06/16/2019    Past Surgical History:  Procedure Laterality Date  . NASAL SINUS SURGERY    . TEMPOROMANDIBULAR JOINT ARTHROPLASTY      OB History   No obstetric history on file.      Home Medications    Prior to Admission medications   Medication Sig Start Date End Date Taking? Authorizing Provider  acetaminophen (TYLENOL) 325 MG tablet Take 650 mg by mouth every 6 (six) hours as needed for pain.    [provider]    albuterol (VENTOLIN HFA) 108 (90 Base) MCG/ACT inhaler Inhale 2 puffs into the lungs every 4 (four) hours as needed for wheezing or shortness of breath (coughing). 06/16/19   Ellamae Sia, DO  EPINEPHrine (EPI-PEN) 0.3 mg/0.3 mL SOAJ injection Inject 0.3 mg into the muscle once.    [provider]  hydrochlorothiazide (MICROZIDE) 12.5 MG capsule Take 12.5 mg by mouth daily.    [provider]  ibuprofen (ADVIL,MOTRIN) 200 MG tablet Take 400 mg by mouth every 6 (six) hours as needed for pain.    [provider]  levonorgestrel (MIRENA) 20 MCG/24HR IUD 1 each by Intrauterine route once.    [provider]  ondansetron (ZOFRAN ODT) 4 MG disintegrating tablet Take 1 tablet (4 mg total) by mouth every 8 (eight) hours as needed for nausea or vomiting. 12/07/14   Renne Crigler, PA-C    Family History Family History  Problem Relation Age of Onset  . Allergic rhinitis Mother   . Allergic rhinitis Son   . Allergic rhinitis Daughter   . Allergic rhinitis Daughter   . Immunodeficiency Neg Hx   . Urticaria Neg Hx   . Eczema Neg Hx   . Angioedema Neg Hx   . Asthma Neg Hx   . Atopy Neg Hx     Social History Social History   Tobacco Use  . Smoking status: Never Smoker  . Smokeless tobacco:  Never Used  Vaping Use  . Vaping Use: Never used  Substance Use Topics  . Alcohol use: Yes    Comment: occ  . Drug use: No     Allergies   Shellfish allergy, Codeine, Penicillins, Milk-related compounds, and Other   Review of Systems Review of Systems   Physical Exam Updated Vital Signs BP 136/84   Pulse 75   Temp 98 F (36.7 C)   Resp 18   SpO2 100%   Physical Exam   ED Treatments / Results  Labs (all labs ordered are listed, but only abnormal results are displayed) Labs Reviewed  PREGNANCY, URINE    EKG  Radiology No results found.  Procedures Procedures (including critical care time)  Medications Ordered in ED Medications   diphenhydrAMINE (BENADRYL) injection 25 mg (25 mg Intravenous Given 08/27/19 1504)  prochlorperazine (COMPAZINE) injection 10 mg (10 mg Intravenous Given 08/27/19 1507)  sodium chloride 0.9 % bolus 1,000 mL (0 mLs Intravenous Stopped 08/27/19 1611)     Initial Impression / Assessment and Plan / ED Course  I have reviewed the triage vital signs and the nursing notes.  Pertinent labs & imaging results that were available during my care of the patient were reviewed by me and considered in my medical decision making (see chart for details).   Pt presenting for headache that she describes as similar to migraines she has had in the past. Headache is frontal and mostly left sided, does not radiate, has nausea and vomiting and photophobia. Reports vomiting up her zofran this morning and she presented to ED for IV meds. DDX includes migraine, cranial bleed, CVA, less concern for brain bleed or CVA given presentation similar to prior per pt and chart review, and pt neuro exam shows no focal findings or deficits.   Given hx and neuro exam, no need for head imaging . Will treat with IV migraine cocktail of compazine and benadryl, along with IVF bolus.   Upon reassessement pt reports headache has mostly resolved but she is not without headache. Is requesting to have IV removed and go home. Pt is able to walk well and will discharge home with outpt neurology referral due to increasing migraine frequency.    Final Clinical Impressions(s) / ED Diagnoses   Final diagnoses:  Other migraine without status migrainosus, not intractable    New Prescriptions New Prescriptions   No medications on file

## 2019-08-27 NOTE — ED Notes (Signed)
Given po fluids 

## 2019-08-27 NOTE — ED Notes (Signed)
Pt assessed and given blanket

## 2019-08-27 NOTE — Discharge Instructions (Signed)
I provided referral to neurology.  Please follow-up with them as directed.  Return the emergency department for any headache, fevers, vomiting, numbness/weakness or any other worsening concerning symptoms.

## 2019-08-27 NOTE — ED Triage Notes (Signed)
Pt here with migraine since last night

## 2019-08-27 NOTE — ED Provider Notes (Signed)
[facility] EMERGENCY DEPARTMENT Provider Note   CSN: [id number] Arrival date & time: 08/27/19  5631     History Chief Complaint  Patient presents with  . Migraine    Rebecca Martinez is a 49 y.o. female with past medical history is of eczema, hypertension, migraines who presents for evaluation of headache that began yesterday.  Patient reports that since yesterday, she has had a left-sided frontal headache.  She states that there is been associated with nausea/vomiting and photophobia.  She states this feels similar to previous headaches that she has had in the past.  He states initially, the headache was mild in nature so she did not take any medications but states that over the last 24 hours, it is got progressively worse, prompting ED visit.  She states that she has a history of migraine and states that this feels similar.  She has been getting more frequent migraines and has been referred to a neurologist by her primary care doctor but cannot see them till October.  She denies any preceding trauma, injury, fall.  Denies any fevers, vision changes, numbness/weakness of arms or legs.   The history is provided by the patient.       Past Medical History:  Diagnosis Date  . Eczema   . Hypertension   . Migraine     Patient Active Problem List   Diagnosis Date Noted  . Adverse food reaction 06/16/2019  . Other allergic rhinitis 06/16/2019  . History of asthma 06/16/2019    Past Surgical History:  Procedure Laterality Date  . NASAL SINUS SURGERY    . TEMPOROMANDIBULAR JOINT ARTHROPLASTY       OB History   No obstetric history on file.     Family History  Problem Relation Age of Onset  . Allergic rhinitis Mother   . Allergic rhinitis Son   . Allergic rhinitis Daughter   . Allergic rhinitis Daughter   . Immunodeficiency Neg Hx   . Urticaria Neg Hx   . Eczema Neg Hx   . Angioedema Neg Hx   . Asthma Neg Hx   . Atopy Neg Hx     Social History    Tobacco Use  . Smoking status: Never Smoker  . Smokeless tobacco: Never Used  Vaping Use  . Vaping Use: Never used  Substance Use Topics  . Alcohol use: Yes    Comment: occ  . Drug use: No    Home Medications Prior to Admission medications   Medication Sig Start Date End Date Taking? Authorizing Provider  acetaminophen (TYLENOL) 325 MG tablet Take 650 mg by mouth every 6 (six) hours as needed for pain.    [provider]  albuterol (VENTOLIN HFA) 108 (90 Base) MCG/ACT inhaler Inhale 2 puffs into the lungs every 4 (four) hours as needed for wheezing or shortness of breath (coughing). 06/16/19   Ellamae Sia, DO  EPINEPHrine (EPI-PEN) 0.3 mg/0.3 mL SOAJ injection Inject 0.3 mg into the muscle once.    [provider]  hydrochlorothiazide (MICROZIDE) 12.5 MG capsule Take 12.5 mg by mouth daily.    [provider]  ibuprofen (ADVIL,MOTRIN) 200 MG tablet Take 400 mg by mouth every 6 (six) hours as needed for pain.    [provider]  levonorgestrel (MIRENA) 20 MCG/24HR IUD 1 each by Intrauterine route once.    [provider]  ondansetron (ZOFRAN ODT) 4 MG disintegrating tablet Take 1 tablet (4 mg total) by mouth every 8 (eight) hours  as needed for nausea or vomiting. 12/07/14   Renne Crigler, PA-C    Allergies    Shellfish allergy, Codeine, Penicillins, Milk-related compounds, and Other  Review of Systems   Review of Systems  Constitutional: Negative for fever.  Eyes: Positive for photophobia.  Gastrointestinal: Positive for nausea and vomiting. Negative for abdominal pain.  Neurological: Negative for weakness, numbness and headaches.  All other systems reviewed and are negative.   Physical Exam Updated Vital Signs BP (!) 149/91 (BP Location: Left Arm)   Pulse 83   Temp 98 F (36.7 C)   Resp 14   SpO2 100%   Physical Exam Vitals and nursing note reviewed.  Constitutional:      Appearance: Normal appearance. She is  well-developed.  HENT:     Head: Normocephalic and atraumatic.     Comments: No tenderness to palpation of skull. No deformities or crepitus noted. No open wounds, abrasions or lacerations.  Eyes:     General: Lids are normal.     Conjunctiva/sclera: Conjunctivae normal.     Pupils: Pupils are equal, round, and reactive to light.     Comments: PERRL. EOMs intact. No nystagmus. No neglect.   Neck:     Comments: Neck is supple and without rigidity.  Cardiovascular:     Rate and Rhythm: Normal rate and regular rhythm.     Pulses: Normal pulses.     Heart sounds: Normal heart sounds. No murmur heard.  No friction rub. No gallop.   Pulmonary:     Effort: Pulmonary effort is normal.     Breath sounds: Normal breath sounds.  Abdominal:     Palpations: Abdomen is soft. Abdomen is not rigid.     Tenderness: There is no abdominal tenderness. There is no guarding.  Musculoskeletal:        General: Normal range of motion.     Cervical back: Full passive range of motion without pain.  Skin:    General: Skin is warm and dry.     Capillary Refill: Capillary refill takes less than 2 seconds.  Neurological:     Mental Status: She is alert and oriented to person, place, and time.     Comments: Cranial nerves III-XII intact Follows commands, Moves all extremities  5/5 strength to BUE and BLE  Sensation intact throughout all major nerve distributions No gait abnormalities  No slurred speech. No facial droop.   Psychiatric:        Speech: Speech normal.     ED Results / Procedures / Treatments   Labs (all labs ordered are listed, but only abnormal results are displayed) Labs Reviewed - No data to display  EKG None  Radiology No results found.  Procedures Procedures (including critical care time)  Medications Ordered in ED Medications  diphenhydrAMINE (BENADRYL) injection 25 mg (25 mg Intravenous Given 08/27/19 1504)  prochlorperazine (COMPAZINE) injection 10 mg (10 mg Intravenous  Given 08/27/19 1507)  sodium chloride 0.9 % bolus 1,000 mL (0 mLs Intravenous Stopped 08/27/19 1611)    ED Course  I have reviewed the triage vital signs and the nursing notes.  Pertinent labs & imaging results that were available during my care of the patient were reviewed by me and considered in my medical decision making (see chart for details).    MDM Rules/Calculators/A&P                          49 year old female who presents for evaluation  of migraine headache.  Patient with history of migraines states this feels similar.  Headache started out mild yesterday and progressively worsened.  No fevers, numbness/weakness.  Associated photophobia, vomiting.  On initial arrival, she is afebrile nontoxic-appearing.  Vital signs are stable.  No neuro deficits on exam.  History/physical exam not concerning for CVA, intracranial hemorrhage, meningitis.  Suspect this is normal migraine.  We will plan to give migraine cocktail and reassess.  Reevaluation after migraine cocktail.  Patient reports improvement in headache.  Patient able ambulate without any difficulty.  She is able to tolerate p.o. in the department any difficulty.  Patient states she is ready to go home.  We will plan to give her ambulatory referral to neurology. At this time, patient exhibits no emergent life-threatening condition that require further evaluation in ED or admission. Patient had ample opportunity for questions and discussion. All patient's questions were answered with full understanding. Strict return precautions discussed. Patient expresses understanding and agreement to plan.   Portions of this note were generated with Scientist, clinical (histocompatibility and immunogenetics). Dictation errors may occur despite best attempts at proofreading.  Final Clinical Impression(s) / ED Diagnoses Final diagnoses:  Other migraine without status migrainosus, not intractable    Rx / DC Orders ED Discharge Orders         Ordered    Ambulatory referral to  Neurology     Discontinue  Reprint    Comments: An appointment is requested in approximately: 4 weeks   08/27/19 1625           Maxwell Caul, PA-C 08/27/19 1654    Sabino Donovan, MD 08/28/19 [phone]

## 2019-08-28 DIAGNOSIS — M9905 Segmental and somatic dysfunction of pelvic region: Secondary | ICD-10-CM | POA: Diagnosis not present

## 2019-08-28 DIAGNOSIS — M9904 Segmental and somatic dysfunction of sacral region: Secondary | ICD-10-CM | POA: Diagnosis not present

## 2019-08-28 DIAGNOSIS — M9903 Segmental and somatic dysfunction of lumbar region: Secondary | ICD-10-CM | POA: Diagnosis not present

## 2019-08-28 DIAGNOSIS — M7918 Myalgia, other site: Secondary | ICD-10-CM | POA: Diagnosis not present

## 2019-09-06 DIAGNOSIS — M9905 Segmental and somatic dysfunction of pelvic region: Secondary | ICD-10-CM | POA: Diagnosis not present

## 2019-09-06 DIAGNOSIS — M7918 Myalgia, other site: Secondary | ICD-10-CM | POA: Diagnosis not present

## 2019-09-06 DIAGNOSIS — M9903 Segmental and somatic dysfunction of lumbar region: Secondary | ICD-10-CM | POA: Diagnosis not present

## 2019-09-06 DIAGNOSIS — M9904 Segmental and somatic dysfunction of sacral region: Secondary | ICD-10-CM | POA: Diagnosis not present

## 2019-09-13 DIAGNOSIS — M7918 Myalgia, other site: Secondary | ICD-10-CM | POA: Diagnosis not present

## 2019-09-13 DIAGNOSIS — M9905 Segmental and somatic dysfunction of pelvic region: Secondary | ICD-10-CM | POA: Diagnosis not present

## 2019-09-13 DIAGNOSIS — M9903 Segmental and somatic dysfunction of lumbar region: Secondary | ICD-10-CM | POA: Diagnosis not present

## 2019-09-13 DIAGNOSIS — M9904 Segmental and somatic dysfunction of sacral region: Secondary | ICD-10-CM | POA: Diagnosis not present

## 2019-09-20 DIAGNOSIS — M7918 Myalgia, other site: Secondary | ICD-10-CM | POA: Diagnosis not present

## 2019-09-20 DIAGNOSIS — M9903 Segmental and somatic dysfunction of lumbar region: Secondary | ICD-10-CM | POA: Diagnosis not present

## 2019-09-20 DIAGNOSIS — M9904 Segmental and somatic dysfunction of sacral region: Secondary | ICD-10-CM | POA: Diagnosis not present

## 2019-09-20 DIAGNOSIS — M9905 Segmental and somatic dysfunction of pelvic region: Secondary | ICD-10-CM | POA: Diagnosis not present

## 2019-09-20 NOTE — Progress Notes (Signed)
NEUROLOGY CONSULTATION NOTE  [name] MRN: [id number] DOB: 25-Nov-1970  Referring provider: Shirlean Mylar, MD Primary care provider: Shirlean Mylar, MD  Reason for consult:  migraines  HISTORY OF PRESENT ILLNESS: Rebecca Martinez is a 49 year old right-handed female with HTN who presents for migraines.  History supplemented by ED and referring provider's notes.  She has had migraines since her early 56s and would have one a year.  They have become frequent over the past year.  She has severe left frontal surging pain with nausea, vomiting, photophobia, phonophobia, and osmophobia.  No visual aura.  They typically cannot abort and requires treatment in the ED.  She has required evaluation and treatment in the ED several times over the past year.  They have been occurring once every 3 to 4 weeks.  She has also had mild headaches that may last several days in a row.  She thinks triggers may be menopause and increased emotional stress.  Her blood pressure has been elevated, requiring increase in her HCTZ.  Current NSAIDS:  ibuprofen Current analgesics:  Tylelnol Current triptans:  none Current ergotamine:  none Current anti-emetic:  Zofran, promethazine Current muscle relaxants:  none Current anti-anxiolytic:  none Current sleep aide:  none Current Antihypertensive medications:  HCTZ Current Antidepressant medications:  none Current Anticonvulsant medications:  none Current anti-CGRP:  none Current Vitamins/Herbal/Supplements:  none Current Antihistamines/Decongestants:  none Other therapy:  none Hormone/birth control:  Mirena  Past NSAIDS:  naproxen Past analgesics:  Tylenol, Excedrin Past abortive triptans:  Sumatriptan [place] (effective in the past many years ago) Past abortive ergotamine:  none Past muscle relaxants:  none Past anti-emetic:  none Past antihypertensive medications:  none Past antidepressant medications:  none Past anticonvulsant medications:  none Past  anti-CGRP:  none Past vitamins/Herbal/Supplements:  none Past antihistamines/decongestants:  none Other past therapies:  none  Caffeine:  1 cup coffee daily.  Occasional Coke Zero Diet:  Drinks water.  Does not skip meals.  Watches what she eats. Exercise:  Routine.  Works out daily. Depression:  no; Anxiety:  Yes.  Stressful job.  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 Other pain:  A little bit of knee pain, mild low back pain. Sleep hygiene:  Good (except for hot flashes) Family history of headache:  No.  Mother had TIAs at age 62.    CBC and BMP were reviewed.   PAST MEDICAL HISTORY: Past Medical History:  Diagnosis Date  . Eczema   . Hypertension   . Migraine     PAST SURGICAL HISTORY: Past Surgical History:  Procedure Laterality Date  . NASAL SINUS SURGERY    . TEMPOROMANDIBULAR JOINT ARTHROPLASTY      MEDICATIONS: Current Outpatient Medications on File Prior to Visit  Medication Sig Dispense Refill  . acetaminophen (TYLENOL) 325 MG tablet Take 650 mg by mouth every 6 (six) hours as needed for pain.    Marland Kitchen albuterol (VENTOLIN HFA) 108 (90 Base) MCG/ACT inhaler Inhale 2 puffs into the lungs every 4 (four) hours as needed for wheezing or shortness of breath (coughing). 18 g 1  . EPINEPHrine (EPI-PEN) 0.3 mg/0.3 mL SOAJ injection Inject 0.3 mg into the muscle once.    . hydrochlorothiazide (MICROZIDE) 12.5 MG capsule Take 12.5 mg by mouth daily.    Marland Kitchen ibuprofen (ADVIL,MOTRIN) 200 MG tablet Take 400 mg by mouth every 6 (six) hours as needed for pain.    Marland Kitchen levonorgestrel (  MIRENA) 20 MCG/24HR IUD 1 each by Intrauterine route once.    . ondansetron (ZOFRAN ODT) 4 MG disintegrating tablet Take 1 tablet (4 mg total) by mouth every 8 (eight) hours as needed for nausea or vomiting. 10 tablet 0   No current facility-administered medications on file prior to visit.    ALLERGIES: Allergies  Allergen Reactions  . Shellfish  Allergy Nausea And Vomiting  . Codeine Nausea Only  . Penicillins Other (See Comments)    unknown  . Milk-Related Compounds Rash  . Other Rash    Some fruits    FAMILY HISTORY: Family History  Problem Relation Age of Onset  . Allergic rhinitis Mother   . Allergic rhinitis Son   . Allergic rhinitis Daughter   . Allergic rhinitis Daughter   . Immunodeficiency Neg Hx   . Urticaria Neg Hx   . Eczema Neg Hx   . Angioedema Neg Hx   . Asthma Neg Hx   . Atopy Neg Hx     SOCIAL HISTORY: Social History   Socioeconomic History  . Marital status: Married    Spouse name: Not on file  . Number of children: Not on file  . Years of education: Not on file  . Highest education level: Not on file  Occupational History  . Not on file  Tobacco Use  . Smoking status: Never Smoker  . Smokeless tobacco: Never Used  Vaping Use  . Vaping Use: Never used  Substance and Sexual Activity  . Alcohol use: Yes    Comment: occ  . Drug use: No  . Sexual activity: Not on file  Other Topics Concern  . Not on file  Social History Narrative  . Not on file   Social Determinants of Health   Financial Resource Strain:   . Difficulty of Paying Living Expenses: Not on file  Food Insecurity:   . Worried About Programme researcher, broadcasting/film/video in the Last Year: Not on file  . Ran Out of Food in the Last Year: Not on file  Transportation Needs:   . Lack of Transportation (Medical): Not on file  . Lack of Transportation (Non-Medical): Not on file  Physical Activity:   . Days of Exercise per Week: Not on file  . Minutes of Exercise per Session: Not on file  Stress:   . Feeling of Stress : Not on file  Social Connections:   . Frequency of Communication with Friends and Family: Not on file  . Frequency of Social Gatherings with Friends and Family: Not on file  . Attends Religious Services: Not on file  . Active Member of Clubs or Organizations: Not on file  . Attends Banker Meetings: Not on file    . Marital Status: Not on file  Intimate Partner Violence:   . Fear of Current or Ex-Partner: Not on file  . Emotionally Abused: Not on file  . Physically Abused: Not on file  . Sexually Abused: Not on file     PHYSICAL EXAM: Blood pressure (!) 167/83, pulse 81, height 5\' 6"  (1.676 m), weight 135 lb (61.2 kg), SpO2 97 %. General: No acute distress.  Patient appears well-groomed.   Head:  Normocephalic/atraumatic Eyes:  fundi examined but not visualized Neck: supple, no paraspinal tenderness, full range of motion Back: No paraspinal tenderness Heart: regular rate and rhythm Lungs: Clear to auscultation bilaterally. Vascular: No carotid bruits. Neurological Exam: Mental status: alert and oriented to person, place, and time, recent and remote memory intact,  fund of knowledge intact, attention and concentration intact, speech fluent and not dysarthric, language intact. Cranial nerves: CN I: not tested CN II: pupils equal, round and reactive to light, visual fields intact CN III, IV, VI:  full range of motion, no nystagmus, no ptosis CN V: facial sensation intact CN VII: upper and lower face symmetric CN VIII: hearing intact CN IX, X: gag intact, uvula midline CN XI: sternocleidomastoid and trapezius muscles intact CN XII: tongue midline Bulk & Tone: normal, no fasciculations. Motor:  5/5 throughout  Sensation:  temperature and vibratory sensation intact.  Deep Tendon Reflexes:  2+ throughout, toes downgoing.  Finger to nose testing:  Without dysmetria.   Heel to shin:  Without dysmetria.   Gait:  Normal station and stride.  Able to turn. Romberg negative.  IMPRESSION: Migraine without aura, without status migrainosus, intractable  PLAN: 1.  For preventative management, start topiramate 25mg  at bedtime.  We can increase to 50mg  at bedtime in 4 weeks if needed. 2.  For abortive therapy, Zembrace SymTouch (sumatriptan) injection.  Also Zofran ODT 4mg  for nausea. 3.  Limit use  of pain relievers to no more than 2 days out of week to prevent risk of rebound or medication-overuse headache. 4.  Keep headache diary 5.  Exercise, hydration, caffeine cessation, sleep hygiene, monitor for and avoid triggers 6.  Consider:  magnesium citrate 400mg  daily, riboflavin 400mg  daily, and coenzyme Q10 100mg  three times daily 7.  Follow up 4 to 6 months   Thank you for allowing me to take part in the care of this patient.  , DO  CC: , MD

## 2019-09-21 ENCOUNTER — Encounter: Payer: Self-pay | Admitting: Neurology

## 2019-09-21 ENCOUNTER — Ambulatory Visit: Payer: BC Managed Care – PPO | Admitting: Neurology

## 2019-09-21 ENCOUNTER — Other Ambulatory Visit: Payer: Self-pay

## 2019-09-21 VITALS — BP 167/83 | HR 81 | Ht 66.0 in | Wt 135.0 lb

## 2019-09-21 DIAGNOSIS — G43019 Migraine without aura, intractable, without status migrainosus: Secondary | ICD-10-CM | POA: Diagnosis not present

## 2019-09-21 MED ORDER — TOPIRAMATE 25 MG PO TABS: 25.0000 mg | ORAL_TABLET | Freq: Every day | ORAL | 5 refills | Status: DC

## 2019-09-21 MED ORDER — ONDANSETRON 4 MG PO TBDP: 4.0000 mg | ORAL_TABLET | Freq: Three times a day (TID) | ORAL | 5 refills | Status: DC | PRN

## 2019-09-21 NOTE — Patient Instructions (Addendum)
  1. Start topiramate 25mg  at bedtime.  Contact in 4 weeks with update and we can increase dose if needed. 2. Take Zembrace (sumatriptan) injection at earliest onset of headache.  May repeat dose once in 1 hour if needed.  Maximum 2 injections in 24 hours. 3. Zofran ODT for nausea 4. Limit use of pain relievers to no more than 2 days out of the week.  These medications include acetaminophen, NSAIDs (ibuprofen/Advil/Motrin, naproxen/Aleve, triptans (Imitrex/sumatriptan), Excedrin, and narcotics.  This will help reduce risk of rebound headaches. 5. Be aware of common food triggers:  - Caffeine:  coffee, black tea, cola, Mt. Dew  - Chocolate  - Dairy:  aged cheeses (brie, blue, cheddar, gouda, [place], provolone, [place], Swiss, etc), chocolate milk, buttermilk, sour cream, limit eggs and yogurt  - Nuts, peanut butter  - Alcohol  - Cereals/grains:  FRESH breads (fresh bagels, sourdough, doughnuts), yeast productions  - Processed/canned/aged/cured meats (pre-packaged deli meats, hotdogs)  - MSG/glutamate:  soy sauce, flavor enhancer, pickled/preserved/marinated foods  - Sweeteners:  aspartame (Equal, Nutrasweet).  Sugar and Splenda are okay  - Vegetables:  legumes (lima beans, lentils, snow peas, fava beans, pinto peans, peas, garbanzo beans), sauerkraut, onions, olives, pickles  - Fruit:  avocados, bananas, citrus fruit (orange, lemon, grapefruit), mango  - Other:  Frozen meals, macaroni and cheese 6. Routine exercise 7. Stay adequately hydrated (aim for 64 oz water daily) 8. Keep headache diary 9. Maintain proper stress management 10. Maintain proper sleep hygiene 11. Do not skip meals 12. Consider supplements:  magnesium citrate 400mg  daily, riboflavin 400mg  daily, coenzyme Q10 100mg  three times daily. 13. Follow up in 4 to 6 months.

## 2019-09-25 DIAGNOSIS — M9903 Segmental and somatic dysfunction of lumbar region: Secondary | ICD-10-CM | POA: Diagnosis not present

## 2019-09-25 DIAGNOSIS — M9904 Segmental and somatic dysfunction of sacral region: Secondary | ICD-10-CM | POA: Diagnosis not present

## 2019-09-25 DIAGNOSIS — M7918 Myalgia, other site: Secondary | ICD-10-CM | POA: Diagnosis not present

## 2019-09-25 DIAGNOSIS — M9905 Segmental and somatic dysfunction of pelvic region: Secondary | ICD-10-CM | POA: Diagnosis not present

## 2019-10-02 DIAGNOSIS — M9905 Segmental and somatic dysfunction of pelvic region: Secondary | ICD-10-CM | POA: Diagnosis not present

## 2019-10-02 DIAGNOSIS — M9904 Segmental and somatic dysfunction of sacral region: Secondary | ICD-10-CM | POA: Diagnosis not present

## 2019-10-02 DIAGNOSIS — M9903 Segmental and somatic dysfunction of lumbar region: Secondary | ICD-10-CM | POA: Diagnosis not present

## 2019-10-02 DIAGNOSIS — M7918 Myalgia, other site: Secondary | ICD-10-CM | POA: Diagnosis not present

## 2019-10-09 DIAGNOSIS — M7918 Myalgia, other site: Secondary | ICD-10-CM | POA: Diagnosis not present

## 2019-10-09 DIAGNOSIS — M9904 Segmental and somatic dysfunction of sacral region: Secondary | ICD-10-CM | POA: Diagnosis not present

## 2019-10-09 DIAGNOSIS — M9903 Segmental and somatic dysfunction of lumbar region: Secondary | ICD-10-CM | POA: Diagnosis not present

## 2019-10-09 DIAGNOSIS — M9905 Segmental and somatic dysfunction of pelvic region: Secondary | ICD-10-CM | POA: Diagnosis not present

## 2019-10-10 DIAGNOSIS — L821 Other seborrheic keratosis: Secondary | ICD-10-CM | POA: Diagnosis not present

## 2019-10-10 DIAGNOSIS — Z85828 Personal history of other malignant neoplasm of skin: Secondary | ICD-10-CM | POA: Diagnosis not present

## 2019-10-10 DIAGNOSIS — D225 Melanocytic nevi of trunk: Secondary | ICD-10-CM | POA: Diagnosis not present

## 2019-10-10 DIAGNOSIS — L814 Other melanin hyperpigmentation: Secondary | ICD-10-CM | POA: Diagnosis not present

## 2019-10-16 DIAGNOSIS — M9905 Segmental and somatic dysfunction of pelvic region: Secondary | ICD-10-CM | POA: Diagnosis not present

## 2019-10-16 DIAGNOSIS — M9903 Segmental and somatic dysfunction of lumbar region: Secondary | ICD-10-CM | POA: Diagnosis not present

## 2019-10-16 DIAGNOSIS — M9904 Segmental and somatic dysfunction of sacral region: Secondary | ICD-10-CM | POA: Diagnosis not present

## 2019-10-16 DIAGNOSIS — M7918 Myalgia, other site: Secondary | ICD-10-CM | POA: Diagnosis not present

## 2019-10-20 DIAGNOSIS — R232 Flushing: Secondary | ICD-10-CM | POA: Diagnosis not present

## 2019-10-20 DIAGNOSIS — R635 Abnormal weight gain: Secondary | ICD-10-CM | POA: Diagnosis not present

## 2019-10-20 DIAGNOSIS — E559 Vitamin D deficiency, unspecified: Secondary | ICD-10-CM | POA: Diagnosis not present

## 2019-10-20 DIAGNOSIS — Z780 Asymptomatic menopausal state: Secondary | ICD-10-CM | POA: Diagnosis not present

## 2019-10-27 ENCOUNTER — Ambulatory Visit: Payer: BC Managed Care – PPO | Admitting: Neurology

## 2019-10-30 DIAGNOSIS — M7918 Myalgia, other site: Secondary | ICD-10-CM | POA: Diagnosis not present

## 2019-10-30 DIAGNOSIS — M9905 Segmental and somatic dysfunction of pelvic region: Secondary | ICD-10-CM | POA: Diagnosis not present

## 2019-10-30 DIAGNOSIS — M9904 Segmental and somatic dysfunction of sacral region: Secondary | ICD-10-CM | POA: Diagnosis not present

## 2019-10-30 DIAGNOSIS — M9903 Segmental and somatic dysfunction of lumbar region: Secondary | ICD-10-CM | POA: Diagnosis not present

## 2019-11-08 DIAGNOSIS — Z780 Asymptomatic menopausal state: Secondary | ICD-10-CM | POA: Diagnosis not present

## 2019-11-08 DIAGNOSIS — R232 Flushing: Secondary | ICD-10-CM | POA: Diagnosis not present

## 2019-11-13 DIAGNOSIS — M9904 Segmental and somatic dysfunction of sacral region: Secondary | ICD-10-CM | POA: Diagnosis not present

## 2019-11-13 DIAGNOSIS — M9903 Segmental and somatic dysfunction of lumbar region: Secondary | ICD-10-CM | POA: Diagnosis not present

## 2019-11-13 DIAGNOSIS — M7918 Myalgia, other site: Secondary | ICD-10-CM | POA: Diagnosis not present

## 2019-11-13 DIAGNOSIS — M9905 Segmental and somatic dysfunction of pelvic region: Secondary | ICD-10-CM | POA: Diagnosis not present

## 2019-11-27 DIAGNOSIS — M7918 Myalgia, other site: Secondary | ICD-10-CM | POA: Diagnosis not present

## 2019-11-27 DIAGNOSIS — M9905 Segmental and somatic dysfunction of pelvic region: Secondary | ICD-10-CM | POA: Diagnosis not present

## 2019-11-27 DIAGNOSIS — M9904 Segmental and somatic dysfunction of sacral region: Secondary | ICD-10-CM | POA: Diagnosis not present

## 2019-11-27 DIAGNOSIS — M9903 Segmental and somatic dysfunction of lumbar region: Secondary | ICD-10-CM | POA: Diagnosis not present

## 2019-12-29 DIAGNOSIS — M9903 Segmental and somatic dysfunction of lumbar region: Secondary | ICD-10-CM | POA: Diagnosis not present

## 2019-12-29 DIAGNOSIS — M9904 Segmental and somatic dysfunction of sacral region: Secondary | ICD-10-CM | POA: Diagnosis not present

## 2019-12-29 DIAGNOSIS — M7918 Myalgia, other site: Secondary | ICD-10-CM | POA: Diagnosis not present

## 2019-12-29 DIAGNOSIS — M9905 Segmental and somatic dysfunction of pelvic region: Secondary | ICD-10-CM | POA: Diagnosis not present

## 2020-03-11 DIAGNOSIS — Z20822 Contact with and (suspected) exposure to covid-19: Secondary | ICD-10-CM | POA: Diagnosis not present

## 2020-03-20 ENCOUNTER — Ambulatory Visit: Payer: BC Managed Care – PPO | Admitting: Neurology

## 2020-04-08 NOTE — Progress Notes (Signed)
NEUROLOGY FOLLOW UP OFFICE NOTE  Rebecca Martinez [id number]  Assessment/Plan:   Migraine without aura, without status migrainosus, not intractable  1.  Migraine prevention:  Magnesium citrate 400mg , B2, CoQ10 300mg  daily 2.  Migraine rescue:  Zembrace and Zofran if needed. 3.  Limit use of pain relievers to no more than 2 days out of week to prevent risk of rebound or medication-overuse headache. 4.  Keep headache diary 5.  Follow up 6 months  Subjective:  Rebecca Martinez is a 50 year old right-handed female with HTN who follows up for migraines.  UPDATE: Never started topiramate because she started taking the vitamin/supplement regimen and headaches improved.  Prescribed Zembrace SymTouch for rescue but never needed it.  PCP started venlafaxine XR 37.5mg  daily for hot flashes (menopause). Intensity:  moderate Duration:  A couple of hours with Zofran and sleep. Frequency:  1 since last visit Current NSAIDS:  ibuprofen Current analgesics:  Tylelnol Current triptans:  none Current ergotamine:  none Current anti-emetic:  Zofran ODT 4mg  Current muscle relaxants:  none Current anti-anxiolytic:  none Current sleep aide:  none Current Antihypertensive medications:  HCTZ Current Antidepressant medications:  none Current Anticonvulsant medications:  none Current anti-CGRP:  none Current Vitamins/Herbal/Supplements:  none Current Antihistamines/Decongestants:  none Other therapy:  none Hormone/birth control:  Mirena  Caffeine:  1 cup coffee daily.  Occasional Coke Zero Diet:  Drinks water.  Does not skip meals.  Watches what she eats. Exercise:  Routine.  Works out daily. Depression:  no; Anxiety:  Yes.  Stressful job.  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 Other pain:  A little bit of knee pain, mild low back pain. Sleep hygiene:  Good (except for hot flashes)  HISTORY: She has had migraines since her early  54s and would have one a year.  They have become frequent over the past year.  She has severe left frontal surging pain with nausea, vomiting, photophobia, phonophobia, and osmophobia.  No visual aura.  They typically cannot abort and requires treatment in the ED.  She has required evaluation and treatment in the ED several times over the past year.  They have been occurring once every 3 to 4 weeks.  She has also had mild headaches that may last several days in a row.  She thinks triggers may be menopause and increased emotional stress.  Her blood pressure has been elevated, requiring increase in her HCTZ.  Past NSAIDS:  naproxen Past analgesics:  Tylenol, Excedrin Past abortive triptans:  Sumatriptan [place] (effective in the past many years ago) Past abortive ergotamine:  none Past muscle relaxants:  none Past anti-emetic:  promethazine Past antihypertensive medications:  none Past antidepressant medications:  none Past anticonvulsant medications:  none Past anti-CGRP:  none Past vitamins/Herbal/Supplements:  none Past antihistamines/decongestants:  none Other past therapies:  none   Family history of headache:  No.  Mother had TIAs at age 68.    PAST MEDICAL HISTORY: Past Medical History:  Diagnosis Date  . Eczema   . Hypertension   . Migraine     MEDICATIONS: Current Outpatient Medications on File Prior to Visit  Medication Sig Dispense Refill  . acetaminophen (TYLENOL) 325 MG tablet Take 650 mg by mouth every 6 (six) hours as needed for pain.    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 18 g 1  . EPINEPHrine (EPI-PEN) 0.3 mg/0.3 mL SOAJ injection Inject 0.3 mg into the muscle once.    . hydrochlorothiazide (HYDRODIURIL) 25 MG tablet Take 12.5 mg by mouth daily.     Marland Kitchen ibuprofen (ADVIL,MOTRIN) 200 MG tablet Take 400 mg by mouth every 6 (six) hours as needed for  pain.    Marland Kitchen levonorgestrel (MIRENA) 20 MCG/24HR IUD 1 each by Intrauterine route once.    . ondansetron (ZOFRAN ODT) 4 MG disintegrating tablet Take 1 tablet (4 mg total) by mouth every 8 (eight) hours as needed for nausea or vomiting. 20 tablet 5  . topiramate (TOPAMAX) 25 MG tablet Take 1 tablet (25 mg total) by mouth at bedtime. 120 tablet 5   No current facility-administered medications on file prior to visit.    ALLERGIES: Allergies  Allergen Reactions  . Shellfish Allergy Nausea And Vomiting  . Codeine Nausea Only  . Penicillins Other (See Comments)    unknown  . Milk-Related Compounds Rash  . Other Rash    Some fruits    FAMILY HISTORY: Family History  Problem Relation Age of Onset  . Allergic rhinitis Mother   . Allergic rhinitis Son   . Allergic rhinitis Daughter   . Allergic rhinitis Daughter   . Immunodeficiency Neg Hx   . Urticaria Neg Hx   . Eczema Neg Hx   . Angioedema Neg Hx   . Asthma Neg Hx   . Atopy Neg Hx       Objective:  Blood pressure 132/82, pulse 73, resp. rate 20, height 5\' 6"  (1.676 m), weight 140 lb (63.5 kg), SpO2 97 %. General: No acute distress.  Patient appears well-groomed.     , DO  CC:  Shon Millet, MD

## 2020-04-09 ENCOUNTER — Encounter: Payer: Self-pay | Admitting: Neurology

## 2020-04-09 ENCOUNTER — Other Ambulatory Visit: Payer: Self-pay

## 2020-04-09 ENCOUNTER — Ambulatory Visit: Payer: BC Managed Care – PPO | Admitting: Neurology

## 2020-04-09 VITALS — BP 132/82 | HR 73 | Resp 20 | Ht 66.0 in | Wt 140.0 lb

## 2020-04-09 DIAGNOSIS — G43019 Migraine without aura, intractable, without status migrainosus: Secondary | ICD-10-CM

## 2020-04-09 NOTE — Patient Instructions (Signed)
  1. Continue:  magnesium citrate 400mg  daily, riboflavin 400mg  daily, coenzyme Q10 300mg  daily. 2.  3. Take Zembrace shot at earliest onset of headache.  May repeat dose once in 1 hour if needed.  Maximum 2 shots in 24 hours. 4. Limit use of pain relievers to no more than 2 days out of the week.  These medications include acetaminophen, NSAIDs (ibuprofen/Advil/Motrin, naproxen/Aleve, triptans (Imitrex/sumatriptan), Excedrin, and narcotics.  This will help reduce risk of rebound headaches. 5. Be aware of common food triggers:  - Caffeine:  coffee, black tea, cola, Mt. Dew  - Chocolate  - Dairy:  aged cheeses (brie, blue, cheddar, gouda, [place], provolone, [place], Swiss, etc), chocolate milk, buttermilk, sour cream, limit eggs and yogurt  - Nuts, peanut butter  - Alcohol  - Cereals/grains:  FRESH breads (fresh bagels, sourdough, doughnuts), yeast productions  - Processed/canned/aged/cured meats (pre-packaged deli meats, hotdogs)  - MSG/glutamate:  soy sauce, flavor enhancer, pickled/preserved/marinated foods  - Sweeteners:  aspartame (Equal, Nutrasweet).  Sugar and Splenda are okay  - Vegetables:  legumes (lima beans, lentils, snow peas, fava beans, pinto peans, peas, garbanzo beans), sauerkraut, onions, olives, pickles  - Fruit:  avocados, bananas, citrus fruit (orange, lemon, grapefruit), mango  - Other:  Frozen meals, macaroni and cheese 6. Routine exercise 7. Stay adequately hydrated (aim for 64 oz water daily) 8. Keep headache diary 9. Maintain proper stress management 10. Maintain proper sleep hygiene 11. Do not skip meals 12. Consider supplements:  magnesium citrate 400mg  daily, riboflavin 400mg  daily, coenzyme Q10 100mg  three times daily.

## 2020-04-16 DIAGNOSIS — N6002 Solitary cyst of left breast: Secondary | ICD-10-CM | POA: Diagnosis not present

## 2020-04-16 DIAGNOSIS — N6081 Other benign mammary dysplasias of right breast: Secondary | ICD-10-CM | POA: Diagnosis not present

## 2020-04-16 DIAGNOSIS — R928 Other abnormal and inconclusive findings on diagnostic imaging of breast: Secondary | ICD-10-CM | POA: Diagnosis not present

## 2020-05-02 DIAGNOSIS — M9903 Segmental and somatic dysfunction of lumbar region: Secondary | ICD-10-CM | POA: Diagnosis not present

## 2020-05-02 DIAGNOSIS — M9905 Segmental and somatic dysfunction of pelvic region: Secondary | ICD-10-CM | POA: Diagnosis not present

## 2020-05-02 DIAGNOSIS — M9904 Segmental and somatic dysfunction of sacral region: Secondary | ICD-10-CM | POA: Diagnosis not present

## 2020-05-02 DIAGNOSIS — M7918 Myalgia, other site: Secondary | ICD-10-CM | POA: Diagnosis not present

## 2020-05-08 DIAGNOSIS — M7918 Myalgia, other site: Secondary | ICD-10-CM | POA: Diagnosis not present

## 2020-05-08 DIAGNOSIS — M9905 Segmental and somatic dysfunction of pelvic region: Secondary | ICD-10-CM | POA: Diagnosis not present

## 2020-05-08 DIAGNOSIS — M9903 Segmental and somatic dysfunction of lumbar region: Secondary | ICD-10-CM | POA: Diagnosis not present

## 2020-05-08 DIAGNOSIS — M9904 Segmental and somatic dysfunction of sacral region: Secondary | ICD-10-CM | POA: Diagnosis not present

## 2020-05-09 DIAGNOSIS — Z0000 Encounter for general adult medical examination without abnormal findings: Secondary | ICD-10-CM | POA: Diagnosis not present

## 2020-05-17 DIAGNOSIS — M9905 Segmental and somatic dysfunction of pelvic region: Secondary | ICD-10-CM | POA: Diagnosis not present

## 2020-05-17 DIAGNOSIS — M9903 Segmental and somatic dysfunction of lumbar region: Secondary | ICD-10-CM | POA: Diagnosis not present

## 2020-05-17 DIAGNOSIS — M7918 Myalgia, other site: Secondary | ICD-10-CM | POA: Diagnosis not present

## 2020-05-17 DIAGNOSIS — M9904 Segmental and somatic dysfunction of sacral region: Secondary | ICD-10-CM | POA: Diagnosis not present

## 2020-05-20 DIAGNOSIS — M9904 Segmental and somatic dysfunction of sacral region: Secondary | ICD-10-CM | POA: Diagnosis not present

## 2020-05-20 DIAGNOSIS — M7918 Myalgia, other site: Secondary | ICD-10-CM | POA: Diagnosis not present

## 2020-05-20 DIAGNOSIS — M9903 Segmental and somatic dysfunction of lumbar region: Secondary | ICD-10-CM | POA: Diagnosis not present

## 2020-05-20 DIAGNOSIS — M9905 Segmental and somatic dysfunction of pelvic region: Secondary | ICD-10-CM | POA: Diagnosis not present

## 2020-06-05 DIAGNOSIS — Z30431 Encounter for routine checking of intrauterine contraceptive device: Secondary | ICD-10-CM | POA: Diagnosis not present

## 2020-06-05 DIAGNOSIS — Z01419 Encounter for gynecological examination (general) (routine) without abnormal findings: Secondary | ICD-10-CM | POA: Diagnosis not present

## 2020-06-07 DIAGNOSIS — E559 Vitamin D deficiency, unspecified: Secondary | ICD-10-CM | POA: Diagnosis not present

## 2020-06-07 DIAGNOSIS — Z23 Encounter for immunization: Secondary | ICD-10-CM | POA: Diagnosis not present

## 2020-06-07 DIAGNOSIS — Z0000 Encounter for general adult medical examination without abnormal findings: Secondary | ICD-10-CM | POA: Diagnosis not present

## 2020-06-07 DIAGNOSIS — Z79899 Other long term (current) drug therapy: Secondary | ICD-10-CM | POA: Diagnosis not present

## 2020-06-12 DIAGNOSIS — M9904 Segmental and somatic dysfunction of sacral region: Secondary | ICD-10-CM | POA: Diagnosis not present

## 2020-06-12 DIAGNOSIS — M9905 Segmental and somatic dysfunction of pelvic region: Secondary | ICD-10-CM | POA: Diagnosis not present

## 2020-06-12 DIAGNOSIS — M9903 Segmental and somatic dysfunction of lumbar region: Secondary | ICD-10-CM | POA: Diagnosis not present

## 2020-06-12 DIAGNOSIS — M7918 Myalgia, other site: Secondary | ICD-10-CM | POA: Diagnosis not present

## 2020-06-26 DIAGNOSIS — R109 Unspecified abdominal pain: Secondary | ICD-10-CM | POA: Diagnosis not present

## 2020-06-26 DIAGNOSIS — E673 Hypervitaminosis D: Secondary | ICD-10-CM | POA: Diagnosis not present

## 2020-06-26 DIAGNOSIS — R0781 Pleurodynia: Secondary | ICD-10-CM | POA: Diagnosis not present

## 2020-06-26 DIAGNOSIS — Z13220 Encounter for screening for lipoid disorders: Secondary | ICD-10-CM | POA: Diagnosis not present

## 2020-07-03 ENCOUNTER — Other Ambulatory Visit: Payer: Self-pay | Admitting: Family Medicine

## 2020-07-03 ENCOUNTER — Ambulatory Visit
Admission: RE | Admit: 2020-07-03 | Discharge: 2020-07-03 | Disposition: A | Discharge status: Home / Self Care | Payer: BC Managed Care – PPO | Source: Ambulatory Visit | Attending: Family Medicine | Admitting: Family Medicine

## 2020-07-03 DIAGNOSIS — R0781 Pleurodynia: Secondary | ICD-10-CM

## 2020-07-03 DIAGNOSIS — R109 Unspecified abdominal pain: Secondary | ICD-10-CM

## 2020-07-11 DIAGNOSIS — H2513 Age-related nuclear cataract, bilateral: Secondary | ICD-10-CM | POA: Diagnosis not present

## 2020-07-11 DIAGNOSIS — H353131 Nonexudative age-related macular degeneration, bilateral, early dry stage: Secondary | ICD-10-CM | POA: Diagnosis not present

## 2020-07-24 ENCOUNTER — Other Ambulatory Visit: Payer: Self-pay | Admitting: Family Medicine

## 2020-07-24 DIAGNOSIS — R109 Unspecified abdominal pain: Secondary | ICD-10-CM

## 2020-07-30 ENCOUNTER — Other Ambulatory Visit: Payer: Self-pay

## 2020-07-30 ENCOUNTER — Ambulatory Visit
Admission: RE | Admit: 2020-07-30 | Discharge: 2020-07-30 | Disposition: A | Discharge status: Home / Self Care | Payer: BC Managed Care – PPO | Source: Ambulatory Visit | Attending: Family Medicine | Admitting: Family Medicine

## 2020-07-30 DIAGNOSIS — R109 Unspecified abdominal pain: Secondary | ICD-10-CM | POA: Diagnosis not present

## 2020-08-05 ENCOUNTER — Other Ambulatory Visit: Payer: Self-pay | Admitting: Family Medicine

## 2020-08-05 DIAGNOSIS — R1011 Right upper quadrant pain: Secondary | ICD-10-CM

## 2020-08-16 ENCOUNTER — Ambulatory Visit
Admission: RE | Admit: 2020-08-16 | Discharge: 2020-08-16 | Disposition: A | Discharge status: Home / Self Care | Payer: BC Managed Care – PPO | Source: Ambulatory Visit | Attending: Family Medicine | Admitting: Family Medicine

## 2020-08-16 DIAGNOSIS — R1011 Right upper quadrant pain: Secondary | ICD-10-CM | POA: Diagnosis not present

## 2020-08-27 DIAGNOSIS — M940 Chondrocostal junction syndrome [Tietze]: Secondary | ICD-10-CM | POA: Diagnosis not present

## 2020-08-27 DIAGNOSIS — D3501 Benign neoplasm of right adrenal gland: Secondary | ICD-10-CM | POA: Diagnosis not present

## 2020-08-27 DIAGNOSIS — K2970 Gastritis, unspecified, without bleeding: Secondary | ICD-10-CM | POA: Diagnosis not present

## 2020-09-23 DIAGNOSIS — R1012 Left upper quadrant pain: Secondary | ICD-10-CM | POA: Diagnosis not present

## 2020-10-03 DIAGNOSIS — K5900 Constipation, unspecified: Secondary | ICD-10-CM | POA: Diagnosis not present

## 2020-10-03 DIAGNOSIS — R1032 Left lower quadrant pain: Secondary | ICD-10-CM | POA: Diagnosis not present

## 2020-10-03 DIAGNOSIS — R110 Nausea: Secondary | ICD-10-CM | POA: Diagnosis not present

## 2020-10-10 ENCOUNTER — Ambulatory Visit: Payer: BC Managed Care – PPO | Admitting: Neurology

## 2020-10-17 DIAGNOSIS — L814 Other melanin hyperpigmentation: Secondary | ICD-10-CM | POA: Diagnosis not present

## 2020-10-17 DIAGNOSIS — L570 Actinic keratosis: Secondary | ICD-10-CM | POA: Diagnosis not present

## 2020-10-17 DIAGNOSIS — D225 Melanocytic nevi of trunk: Secondary | ICD-10-CM | POA: Diagnosis not present

## 2020-10-17 DIAGNOSIS — Z23 Encounter for immunization: Secondary | ICD-10-CM | POA: Diagnosis not present

## 2020-10-17 DIAGNOSIS — L821 Other seborrheic keratosis: Secondary | ICD-10-CM | POA: Diagnosis not present

## 2020-10-17 DIAGNOSIS — Z85828 Personal history of other malignant neoplasm of skin: Secondary | ICD-10-CM | POA: Diagnosis not present

## 2020-11-14 DIAGNOSIS — K5900 Constipation, unspecified: Secondary | ICD-10-CM | POA: Diagnosis not present

## 2020-11-14 DIAGNOSIS — R110 Nausea: Secondary | ICD-10-CM | POA: Diagnosis not present

## 2020-11-14 DIAGNOSIS — R1032 Left lower quadrant pain: Secondary | ICD-10-CM | POA: Diagnosis not present

## 2020-11-22 ENCOUNTER — Other Ambulatory Visit: Payer: Self-pay | Admitting: Neurology

## 2020-11-28 DIAGNOSIS — I10 Essential (primary) hypertension: Secondary | ICD-10-CM | POA: Diagnosis not present

## 2020-11-28 DIAGNOSIS — J0100 Acute maxillary sinusitis, unspecified: Secondary | ICD-10-CM | POA: Diagnosis not present

## 2020-11-28 DIAGNOSIS — Z20822 Contact with and (suspected) exposure to covid-19: Secondary | ICD-10-CM | POA: Diagnosis not present

## 2020-11-28 DIAGNOSIS — Z03818 Encounter for observation for suspected exposure to other biological agents ruled out: Secondary | ICD-10-CM | POA: Diagnosis not present

## 2021-01-14 DIAGNOSIS — D123 Benign neoplasm of transverse colon: Secondary | ICD-10-CM | POA: Diagnosis not present

## 2021-01-14 DIAGNOSIS — Z1211 Encounter for screening for malignant neoplasm of colon: Secondary | ICD-10-CM | POA: Diagnosis not present

## 2021-04-11 ENCOUNTER — Ambulatory Visit: Payer: BC Managed Care – PPO | Admitting: Neurology

## 2021-04-17 DIAGNOSIS — Z1231 Encounter for screening mammogram for malignant neoplasm of breast: Secondary | ICD-10-CM | POA: Diagnosis not present

## 2021-04-21 NOTE — Progress Notes (Signed)
? ?NEUROLOGY FOLLOW UP OFFICE NOTE ? ?Rebecca Martinez ?[id number] ? ?Assessment/Plan:  ? ?Migraine without aura, without status migrainosus, not intractable ?Tension type headache ?Hypertension ?  ?1.  Migraine prevention:  Magnesium citrate 400mg , B2, CoQ10 300mg  daily.  Also on venlafaxine XR 37.5mg  daily ?2.  Migraine rescue:  Zembrace and Zofran if needed. ?3.  Limit use of pain relievers to no more than 2 days out of week to prevent risk of rebound or medication-overuse headache. ?4.  Keep headache diary ?5.  Follow up with PCP regarding BP ?6.  Follow up 1 year ?  ?Subjective:  ?Rebecca Martinez is a 51year old right-handed female with HTN who follows up for migraines. ?  ?UPDATE: ?Intensity:  moderate ?Duration:  within 30 minutes with Zembrace (had a sample) ?Frequency:  2 since last visit.   ?May have a mild tension-type headache a couple of times a month (varies based on stress). ?Current NSAIDS:  ibuprofen ?Current analgesics:  Tylelnol ?Current triptans:  none ?Current ergotamine:  none ?Current anti-emetic:  Zofran ODT 4mg  ?Current muscle relaxants:  none ?Current anti-anxiolytic:  none ?Current sleep aide:  none ?Current Antihypertensive medications:  HCTZ ?Current Antidepressant medications:  venlafaxine XR 37.5mg  daily (for hot flashes) ?Current Anticonvulsant medications:  none ?Current anti-CGRP:  none ?Current Vitamins/Herbal/Supplements:  none ?Current Antihistamines/Decongestants:  none ?Other therapy:  none ?Hormone/birth control:  Mirena ?  ?Caffeine:  1 cup coffee daily.  Occasional Coke Zero ?Diet:  Drinks water.  Does not skip meals.  Watches what she eats. ?Exercise:  Routine.  Works out daily. ?Depression:  no; Anxiety:  Yes.  Stressful job.  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 ?Other pain:  A little bit of knee pain, mild low back pain. ?Sleep hygiene:  Good (except for hot flashes) ?  ?HISTORY:  ?She has had migraines  since her early 36s and would have one a year.  They have become frequent over the past year.  She has severe left frontal surging pain with nausea, vomiting, photophobia, phonophobia, and osmophobia.  No visual aura.  They typically cannot abort and requires treatment in the ED.  She has required evaluation and treatment in the ED several times over the past year.  They have been occurring once every 3 to 4 weeks.  She has also had mild headaches that may last several days in a row.  She thinks triggers may be menopause and increased emotional stress.  Her blood pressure has been elevated, requiring increase in her HCTZ. ?  ?Past NSAIDS:  naproxen ?Past analgesics:  Tylenol, Excedrin ?Past abortive triptans:  Sumatriptan [place] (effective in the past many years ago) ?Past abortive ergotamine:  none ?Past muscle relaxants:  none ?Past anti-emetic:  promethazine ?Past antihypertensive medications:  none ?Past antidepressant medications:  none ?Past anticonvulsant medications:  none ?Past anti-CGRP:  none ?Past vitamins/Herbal/Supplements:  none ?Past antihistamines/decongestants:  none ?Other past therapies:  none ?  ?  ?Family history of headache:  No.  Mother had TIAs at age 53.   ? ?PAST MEDICAL HISTORY: ?Past Medical History:  ?Diagnosis Date  ? Eczema   ? Hypertension   ? Migraine   ? ? ?MEDICATIONS: ?Current Outpatient Medications on File Prior to Visit  ?Medication Sig Dispense Refill  ? acetaminophen (TYLENOL) 325 MG tablet Take 650 mg by mouth every 6 (six) hours as needed for pain.    ? albuterol (VENTOLIN HFA) 108 (90 Base)  MCG/ACT inhaler Inhale 2 puffs into the lungs every 4 (four) hours as needed for wheezing or shortness of breath (coughing). 18 g 1  ? B-COMPLEX-C PO Take by mouth.    ? co-enzyme Q-10 30 MG capsule Take 30 mg by mouth 3 (three) times daily.    ? EPINEPHrine (EPI-PEN) 0.3 mg/0.3 mL SOAJ injection Inject 0.3 mg into the muscle once.    ? hydrochlorothiazide (HYDRODIURIL) 25 MG tablet Take  12.5 mg by mouth daily.     ? ibuprofen (ADVIL,MOTRIN) 200 MG tablet Take 400 mg by mouth every 6 (six) hours as needed for pain.    ? levonorgestrel (MIRENA) 20 MCG/24HR IUD 1 each by Intrauterine route once.    ? magnesium 30 MG tablet Take 30 mg by mouth once.    ? ondansetron (ZOFRAN ODT) 4 MG disintegrating tablet Take 1 tablet (4 mg total) by mouth every 8 (eight) hours as needed for nausea or vomiting. 20 tablet 5  ? potassium chloride (KLOR-CON) 20 MEQ packet Take by mouth 2 (two) times daily.    ? topiramate (TOPAMAX) 25 MG tablet Take 1 tablet (25 mg total) by mouth at bedtime. (Patient not taking: Reported on 04/09/2020) 120 tablet 5  ? venlafaxine XR (EFFEXOR XR) 37.5 MG 24 hr capsule Take 37.5 mg by mouth daily with breakfast.    ? ?No current facility-administered medications on file prior to visit.  ? ? ?ALLERGIES: ?Allergies  ?Allergen Reactions  ? Shellfish Allergy Nausea And Vomiting  ? Codeine Nausea Only  ? Penicillins Other (See Comments)  ?  unknown  ? Milk-Related Compounds Rash  ? Other Rash  ?  Some fruits  ? ? ?FAMILY HISTORY: ?Family History  ?Problem Relation Age of Onset  ? Allergic rhinitis Mother   ? Allergic rhinitis Son   ? Allergic rhinitis Daughter   ? Allergic rhinitis Daughter   ? Immunodeficiency Neg Hx   ? Urticaria Neg Hx   ? Eczema Neg Hx   ? Angioedema Neg Hx   ? Asthma Neg Hx   ? Atopy Neg Hx   ? ? ?  ?Objective:  ?Blood pressure (!) 160/85, pulse 86, height 5\' 6"  (1.676 m), weight 157 lb (71.2 kg), SpO2 97 %. ?General: No acute distress.  Patient appears well-groomed.   ?Head:  Normocephalic/atraumatic ?Eyes:  Fundi examined but not visualized ?Neck: supple, no paraspinal tenderness, full range of motion ?Heart:  Regular rate and rhythm ?Lungs:  Clear to auscultation bilaterally ?Back: No paraspinal tenderness ?Neurological Exam: alert and oriented to person, place, and time.  Speech fluent and not dysarthric, language intact.  CN II-XII intact. Bulk and tone normal, muscle  strength 5/5 throughout.  Sensation to light touch intact.  Deep tendon reflexes 2+ throughout.  Finger to nose testing intact.  Gait normal, Romberg negative. ? ? ? , DO ? ? ? ? ? ? ? ?

## 2021-04-22 ENCOUNTER — Ambulatory Visit: Payer: BC Managed Care – PPO | Admitting: Neurology

## 2021-04-22 ENCOUNTER — Encounter: Payer: Self-pay | Admitting: Neurology

## 2021-04-22 VITALS — BP 160/85 | HR 86 | Ht 66.0 in | Wt 157.0 lb

## 2021-04-22 DIAGNOSIS — G43009 Migraine without aura, not intractable, without status migrainosus: Secondary | ICD-10-CM | POA: Diagnosis not present

## 2021-04-22 DIAGNOSIS — I10 Essential (primary) hypertension: Secondary | ICD-10-CM

## 2021-04-22 DIAGNOSIS — G44219 Episodic tension-type headache, not intractable: Secondary | ICD-10-CM | POA: Diagnosis not present

## 2021-04-22 MED ORDER — ZEMBRACE SYMTOUCH 3 MG/0.5ML ~~LOC~~ SOAJ: 3.0000 mg | SUBCUTANEOUS | 5 refills | Status: AC | PRN

## 2021-04-22 NOTE — Patient Instructions (Signed)
Sent script for Zembrace injection to [facility] out of [place].  Watch for a text message. ?Limit use of pain relievers to no more than 2 days out of week to prevent risk of rebound or medication-overuse headache. ?Keep headache diary ?

## 2021-04-23 ENCOUNTER — Telehealth: Payer: Self-pay

## 2021-04-23 ENCOUNTER — Other Ambulatory Visit (HOSPITAL_COMMUNITY): Payer: Self-pay

## 2021-04-23 NOTE — Telephone Encounter (Signed)
Patient Advocate Encounter ?  ?Received notification from [facility] that prior authorization for Zembrace SymTouch 3MG /0.5ML auto-injectors is required by his/her insurance [employer]. ?  ?PA submitted on 04/23/21 ? ?Key#:  [id number] ? ?Status is pending ?   ?[facility] will continue to follow: ? ?Patient Advocate ?Fax: [phone]  ?

## 2021-04-24 DIAGNOSIS — R928 Other abnormal and inconclusive findings on diagnostic imaging of breast: Secondary | ICD-10-CM | POA: Diagnosis not present

## 2021-04-24 DIAGNOSIS — R922 Inconclusive mammogram: Secondary | ICD-10-CM | POA: Diagnosis not present

## 2021-04-24 NOTE — Telephone Encounter (Signed)
Patient Advocate Encounter ? ?Received notification from [facility] that the request for prior authorization for Zembrance injection has been denied because: ? ?[facility] Pharm[facility] Patient Advocate ?Fax: [phone]  ?

## 2021-04-28 DIAGNOSIS — M25562 Pain in left knee: Secondary | ICD-10-CM | POA: Diagnosis not present

## 2021-04-28 DIAGNOSIS — M1712 Unilateral primary osteoarthritis, left knee: Secondary | ICD-10-CM | POA: Diagnosis not present

## 2021-04-28 DIAGNOSIS — M79641 Pain in right hand: Secondary | ICD-10-CM | POA: Diagnosis not present

## 2021-04-30 DIAGNOSIS — N6011 Diffuse cystic mastopathy of right breast: Secondary | ICD-10-CM | POA: Diagnosis not present

## 2021-04-30 DIAGNOSIS — N6341 Unspecified lump in right breast, subareolar: Secondary | ICD-10-CM | POA: Diagnosis not present

## 2021-05-15 DIAGNOSIS — M79641 Pain in right hand: Secondary | ICD-10-CM | POA: Diagnosis not present

## 2021-05-15 DIAGNOSIS — M25562 Pain in left knee: Secondary | ICD-10-CM | POA: Diagnosis not present

## 2021-07-01 DIAGNOSIS — R6884 Jaw pain: Secondary | ICD-10-CM | POA: Diagnosis not present

## 2021-07-01 DIAGNOSIS — H9202 Otalgia, left ear: Secondary | ICD-10-CM | POA: Diagnosis not present

## 2021-07-01 DIAGNOSIS — L237 Allergic contact dermatitis due to plants, except food: Secondary | ICD-10-CM | POA: Diagnosis not present

## 2021-07-03 DIAGNOSIS — Z01419 Encounter for gynecological examination (general) (routine) without abnormal findings: Secondary | ICD-10-CM | POA: Diagnosis not present

## 2021-07-03 DIAGNOSIS — Z30431 Encounter for routine checking of intrauterine contraceptive device: Secondary | ICD-10-CM | POA: Diagnosis not present

## 2021-07-11 DIAGNOSIS — E559 Vitamin D deficiency, unspecified: Secondary | ICD-10-CM | POA: Diagnosis not present

## 2021-07-11 DIAGNOSIS — I10 Essential (primary) hypertension: Secondary | ICD-10-CM | POA: Diagnosis not present

## 2021-07-11 DIAGNOSIS — Z0000 Encounter for general adult medical examination without abnormal findings: Secondary | ICD-10-CM | POA: Diagnosis not present

## 2021-07-11 DIAGNOSIS — Z13220 Encounter for screening for lipoid disorders: Secondary | ICD-10-CM | POA: Diagnosis not present

## 2021-07-14 DIAGNOSIS — L03115 Cellulitis of right lower limb: Secondary | ICD-10-CM | POA: Diagnosis not present

## 2021-07-14 DIAGNOSIS — L255 Unspecified contact dermatitis due to plants, except food: Secondary | ICD-10-CM | POA: Diagnosis not present

## 2021-09-05 DIAGNOSIS — H35362 Drusen (degenerative) of macula, left eye: Secondary | ICD-10-CM | POA: Diagnosis not present

## 2021-09-09 DIAGNOSIS — L089 Local infection of the skin and subcutaneous tissue, unspecified: Secondary | ICD-10-CM | POA: Diagnosis not present

## 2021-09-09 DIAGNOSIS — S40862A Insect bite (nonvenomous) of left upper arm, initial encounter: Secondary | ICD-10-CM | POA: Diagnosis not present

## 2021-09-09 DIAGNOSIS — W57XXXA Bitten or stung by nonvenomous insect and other nonvenomous arthropods, initial encounter: Secondary | ICD-10-CM | POA: Diagnosis not present

## 2021-09-16 DIAGNOSIS — R87810 Cervical high risk human papillomavirus (HPV) DNA test positive: Secondary | ICD-10-CM | POA: Diagnosis not present

## 2021-09-16 DIAGNOSIS — N870 Mild cervical dysplasia: Secondary | ICD-10-CM | POA: Diagnosis not present

## 2021-09-16 DIAGNOSIS — N879 Dysplasia of cervix uteri, unspecified: Secondary | ICD-10-CM | POA: Diagnosis not present

## 2021-10-09 DIAGNOSIS — I10 Essential (primary) hypertension: Secondary | ICD-10-CM | POA: Diagnosis not present

## 2021-10-09 DIAGNOSIS — M5442 Lumbago with sciatica, left side: Secondary | ICD-10-CM | POA: Diagnosis not present

## 2021-10-09 DIAGNOSIS — F988 Other specified behavioral and emotional disorders with onset usually occurring in childhood and adolescence: Secondary | ICD-10-CM | POA: Diagnosis not present

## 2021-10-09 DIAGNOSIS — M5441 Lumbago with sciatica, right side: Secondary | ICD-10-CM | POA: Diagnosis not present

## 2021-10-13 DIAGNOSIS — R2689 Other abnormalities of gait and mobility: Secondary | ICD-10-CM | POA: Diagnosis not present

## 2021-10-13 DIAGNOSIS — M545 Low back pain, unspecified: Secondary | ICD-10-CM | POA: Diagnosis not present

## 2021-10-24 DIAGNOSIS — R2689 Other abnormalities of gait and mobility: Secondary | ICD-10-CM | POA: Diagnosis not present

## 2021-10-24 DIAGNOSIS — M545 Low back pain, unspecified: Secondary | ICD-10-CM | POA: Diagnosis not present

## 2021-11-03 DIAGNOSIS — R2689 Other abnormalities of gait and mobility: Secondary | ICD-10-CM | POA: Diagnosis not present

## 2021-11-03 DIAGNOSIS — M545 Low back pain, unspecified: Secondary | ICD-10-CM | POA: Diagnosis not present

## 2021-11-07 NOTE — Progress Notes (Unsigned)
NEUROLOGY FOLLOW UP OFFICE NOTE  Rebecca Martinez [id number]  Assessment/Plan:   Clumsiness, numbness, hyperreflexia - main concern would be a cervical myelopathy.  I don't appreciate clonus or Hoffman's sign on my exam, but she does have hyperreflexia.  Check MRI of cervical spine without contrast Further recommendations pending results.  Otherwise, keep follow up appointment in April.  Subjective:  Rebecca Martinez is a 51 year old right-handed female with hypertension and migraines who presents for clumsiness.    For a while, she reports some clumsiness in her hands.  She will often drop objects.  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  In April, she had two injuries during a mud race in which she slid down and hill and later fell off the monkey bars and fractured her right hand.  She has been experiencing low back pain that will spasm and sometimes radiate down both legs.  She went to physical therapy and she was noted to exhibit right Hoffman's sign and one beat of clonus bilaterally on exam.     PAST MEDICAL HISTORY: Past Medical History:  Diagnosis Date   Eczema    Hypertension    Migraine     MEDICATIONS: Current Outpatient Medications on File Prior to Visit  Medication Sig Dispense Refill   acetaminophen (TYLENOL) 325 MG tablet Take 650 mg by mouth every 6 (six) hours as needed for pain.     albuterol (VENTOLIN HFA) 108 (90 Base) MCG/ACT inhaler Inhale 2 puffs into the lungs every 4 (four) hours as needed for wheezing or shortness of breath (coughing). 18 g 1   B-COMPLEX-C PO Take by mouth.     co-enzyme Q-10 30 MG capsule Take 30 mg by mouth 3 (three) times daily.     EPINEPHrine (EPI-PEN) 0.3 mg/0.3 mL SOAJ injection Inject 0.3 mg into the muscle once.     hydrochlorothiazide (HYDRODIURIL) 25 MG tablet Take 12.5 mg by mouth daily.      ibuprofen (ADVIL,MOTRIN) 200 MG tablet Take 400 mg by mouth every 6 (six) hours as needed for pain.      levonorgestrel (MIRENA) 20 MCG/24HR IUD 1 each by Intrauterine route once.     magnesium 30 MG tablet Take 30 mg by mouth once.     ondansetron (ZOFRAN ODT) 4 MG disintegrating tablet Take 1 tablet (4 mg total) by mouth every 8 (eight) hours as needed for nausea or vomiting. 20 tablet 5   potassium chloride (KLOR-CON) 20 MEQ packet Take by mouth 2 (two) times daily.     SUMAtriptan Succinate (ZEMBRACE SYMTOUCH) 3 MG/0.5ML SOAJ Inject 3 mg into the skin as needed (May repeat after 1 hour.  Maximum 2 injections in 24 hours.). 3 mL 5   topiramate (TOPAMAX) 25 MG tablet Take 1 tablet (25 mg total) by mouth at bedtime. (Patient not taking: Reported on 04/09/2020) 120 tablet 5   venlafaxine XR (EFFEXOR-XR) 37.5 MG 24 hr capsule Take 37.5 mg by mouth daily with breakfast.     No current facility-administered medications on file prior to visit.    ALLERGIES: Allergies  Allergen Reactions   Shellfish Allergy Nausea And Vomiting   Codeine Nausea Only   Penicillins Other (See Comments)    unknown   Milk-Related Compounds Rash   Other Rash    Some fruits    FAMILY HISTORY: Family History  Problem Relation Age of Onset   Allergic rhinitis Mother    Allergic rhinitis Son    Allergic  rhinitis Daughter    Allergic rhinitis Daughter    Immunodeficiency Neg Hx    Urticaria Neg Hx    Eczema Neg Hx    Angioedema Neg Hx    Asthma Neg Hx    Atopy Neg Hx       Objective:  Blood pressure 126/83, pulse 76, resp. rate 18, height 5' 6.5" (1.689 m), weight 157 lb (71.2 kg), SpO2 97 %. General: No acute distress.  Patient appears well-groomed.   Head:  Normocephalic/atraumatic Eyes:  Fundi examined but not visualized Neck: supple, no paraspinal tenderness, full range of motion Heart:  Regular rate and rhythm Lungs:  Clear to auscultation bilaterally Back: No paraspinal tenderness Neurological Exam: alert and oriented to person, place, and time.  Speech fluent and not dysarthric, language intact.   CN II-XII intact. Bulk and tone normal, muscle strength 5/5 throughout.  Sensation to pinprick reduced slightly in first 3 digits in left hand and and fingers of right hand, more prominent in the left index finger..  Deep tendon reflexes 3+ throughout, clonus absent, Hoffman absent, toes downgoing.  Finger to nose testing intact.  Gait normal, able to turn and tandem walk, Romberg negative.   Metta Clines, DO  CC: Windy Carina, FNP

## 2021-11-10 DIAGNOSIS — R2689 Other abnormalities of gait and mobility: Secondary | ICD-10-CM | POA: Diagnosis not present

## 2021-11-10 DIAGNOSIS — M545 Low back pain, unspecified: Secondary | ICD-10-CM | POA: Diagnosis not present

## 2021-11-10 DIAGNOSIS — D485 Neoplasm of uncertain behavior of skin: Secondary | ICD-10-CM | POA: Diagnosis not present

## 2021-11-10 DIAGNOSIS — L821 Other seborrheic keratosis: Secondary | ICD-10-CM | POA: Diagnosis not present

## 2021-11-10 DIAGNOSIS — L578 Other skin changes due to chronic exposure to nonionizing radiation: Secondary | ICD-10-CM | POA: Diagnosis not present

## 2021-11-10 DIAGNOSIS — D225 Melanocytic nevi of trunk: Secondary | ICD-10-CM | POA: Diagnosis not present

## 2021-11-10 DIAGNOSIS — L814 Other melanin hyperpigmentation: Secondary | ICD-10-CM | POA: Diagnosis not present

## 2021-11-11 ENCOUNTER — Encounter: Payer: Self-pay | Admitting: Neurology

## 2021-11-11 ENCOUNTER — Ambulatory Visit: Payer: BC Managed Care – PPO | Admitting: Neurology

## 2021-11-11 VITALS — BP 126/83 | HR 76 | Resp 18 | Ht 66.5 in | Wt 157.0 lb

## 2021-11-11 DIAGNOSIS — R278 Other lack of coordination: Secondary | ICD-10-CM

## 2021-11-11 DIAGNOSIS — R292 Abnormal reflex: Secondary | ICD-10-CM | POA: Diagnosis not present

## 2021-11-11 DIAGNOSIS — R200 Anesthesia of skin: Secondary | ICD-10-CM | POA: Diagnosis not present

## 2021-11-11 NOTE — Patient Instructions (Addendum)
Will check MRI of cervical spine without contrast.We have sent a referral to [facility] for your MRI and they will call you directly to schedule your appointment. They are located at [place]. If you need to contact them directly please call [phone].  Further recommendations pending results. In meantime, keep appointment with me in April

## 2021-11-17 DIAGNOSIS — M545 Low back pain, unspecified: Secondary | ICD-10-CM | POA: Diagnosis not present

## 2021-11-17 DIAGNOSIS — R2689 Other abnormalities of gait and mobility: Secondary | ICD-10-CM | POA: Diagnosis not present

## 2021-11-24 DIAGNOSIS — R2689 Other abnormalities of gait and mobility: Secondary | ICD-10-CM | POA: Diagnosis not present

## 2021-11-24 DIAGNOSIS — M545 Low back pain, unspecified: Secondary | ICD-10-CM | POA: Diagnosis not present

## 2021-12-01 DIAGNOSIS — M545 Low back pain, unspecified: Secondary | ICD-10-CM | POA: Diagnosis not present

## 2021-12-01 DIAGNOSIS — R2689 Other abnormalities of gait and mobility: Secondary | ICD-10-CM | POA: Diagnosis not present

## 2021-12-03 ENCOUNTER — Ambulatory Visit
Admission: RE | Admit: 2021-12-03 | Discharge: 2021-12-03 | Disposition: A | Discharge status: Home / Self Care | Payer: BC Managed Care – PPO | Source: Ambulatory Visit | Attending: Neurology | Admitting: Neurology

## 2021-12-03 DIAGNOSIS — R292 Abnormal reflex: Secondary | ICD-10-CM

## 2021-12-03 DIAGNOSIS — R200 Anesthesia of skin: Secondary | ICD-10-CM | POA: Diagnosis not present

## 2021-12-03 DIAGNOSIS — M4802 Spinal stenosis, cervical region: Secondary | ICD-10-CM | POA: Diagnosis not present

## 2021-12-10 NOTE — Progress Notes (Signed)
Patient wants to know what she should do.

## 2021-12-11 ENCOUNTER — Telehealth: Payer: Self-pay

## 2021-12-11 DIAGNOSIS — R200 Anesthesia of skin: Secondary | ICD-10-CM

## 2021-12-11 DIAGNOSIS — R2689 Other abnormalities of gait and mobility: Secondary | ICD-10-CM | POA: Diagnosis not present

## 2021-12-11 DIAGNOSIS — M545 Low back pain, unspecified: Secondary | ICD-10-CM | POA: Diagnosis not present

## 2021-12-11 NOTE — Telephone Encounter (Signed)
Called patient and could not leave a message the mailbox was full will try back

## 2021-12-11 NOTE — Telephone Encounter (Signed)
Patient advised to sschedule for a EMG.   Front desk call and schedule patient for EMG of the BLUE.

## 2021-12-11 NOTE — Telephone Encounter (Signed)
-----   Message from Drema Dallas, DO sent at 12/10/2021  3:38 PM EST ----- Nerve conduction study of upper extremities for bilateral hand numbness ----- Message ----- From: Leida Lauth, CMA Sent: 12/10/2021   3:23 PM EST To: Drema Dallas, DO  Patient wants to know what she should do.

## 2021-12-12 NOTE — Telephone Encounter (Signed)
Patient is sch for 1*-2-24 with Allena Katz for the EMG

## 2021-12-15 ENCOUNTER — Encounter: Payer: Self-pay | Admitting: Neurology

## 2021-12-16 DIAGNOSIS — M545 Low back pain, unspecified: Secondary | ICD-10-CM | POA: Diagnosis not present

## 2021-12-16 DIAGNOSIS — R2689 Other abnormalities of gait and mobility: Secondary | ICD-10-CM | POA: Diagnosis not present

## 2021-12-22 DIAGNOSIS — M545 Low back pain, unspecified: Secondary | ICD-10-CM | POA: Diagnosis not present

## 2021-12-22 DIAGNOSIS — R2689 Other abnormalities of gait and mobility: Secondary | ICD-10-CM | POA: Diagnosis not present

## 2022-01-09 DIAGNOSIS — M545 Low back pain, unspecified: Secondary | ICD-10-CM | POA: Diagnosis not present

## 2022-01-09 DIAGNOSIS — R2689 Other abnormalities of gait and mobility: Secondary | ICD-10-CM | POA: Diagnosis not present

## 2022-01-13 ENCOUNTER — Ambulatory Visit: Payer: BC Managed Care – PPO | Admitting: Neurology

## 2022-01-13 DIAGNOSIS — R200 Anesthesia of skin: Secondary | ICD-10-CM

## 2022-01-13 NOTE — Procedures (Unsigned)
[facility]  [address], [place]  [place], [place] [address] Tel: [phone] Fax: [phone] Test Date:  01/13/2022  Patient: Rebecca Martinez DOB: 1970-03-29 Physician: Narda Amber, DO  Sex: Female Height: 5\' 6"  Ref Phys: Metta Clines, DO  ID#: [id number]   Technician:    History: This is a 52 year old female referred for evaluation of bilateral hand paresthesias.  NCV & EMG Findings: Extensive electrodiagnostic testing of the right upper extremity and additional studies of the left shows: Bilateral median, ulnar, and mixed palmar sensory responses are within normal limits. Bilateral median and ulnar motor responses are within normal limits. There is no evidence of active or chronic motor axonal loss changes affecting any of the tested muscles.  Motor unit configuration and recruitment pattern is within normal limits.   Impression: This is a normal study of the upper extremities.  In particular, there is no evidence of carpal tunnel syndrome or a cervical radiculopathy.   ___________________________ Narda Amber, DO    Nerve Conduction Studies   Stim Site NR Peak (ms) Norm Peak (ms) O-P Amp (V) Norm O-P Amp  Left Median Anti Sensory (2nd Digit)  32 C  Wrist    2.9 <3.6 51.4 >15  Right Median Anti Sensory (2nd Digit)  32 C  Wrist    2.6 <3.6 45.9 >15  Left Ulnar Anti Sensory (5th Digit)  32 C  Wrist    2.9 <3.1 51.9 >10  Right Ulnar Anti Sensory (5th Digit)  32 C  Wrist    2.3 <3.1 55.4 >10     Stim Site NR Onset (ms) Norm Onset (ms) O-P Amp (mV) Norm O-P Amp Site1 Site2 Delta-0 (ms) Dist (cm) Vel (m/s) Norm Vel (m/s)  Left Median Motor (Abd Poll Brev)  32 C  Wrist    3.0 <4.0 8.9 >6 Elbow Wrist 4.7 30.0 64 >50  Elbow    7.7  8.8         Right Median Motor (Abd Poll Brev)  32 C  Wrist    2.8 <4.0 8.5 >6 Elbow Wrist 4.8 30.0 63 >50  Elbow    7.6  8.3         Left Ulnar Motor (Abd Dig Minimi)  32 C  Wrist    2.2 <3.1 11.0 >7 B Elbow Wrist  3.6 22.0 61 >50  B Elbow    5.8  10.1  A Elbow B Elbow 1.6 10.0 62 >50  A Elbow    7.4  10.0         Right Ulnar Motor (Abd Dig Minimi)  32 C  Wrist    2.0 <3.1 10.6 >7 B Elbow Wrist 3.5 22.0 63 >50  B Elbow    5.5  10.7  A Elbow B Elbow 1.3 10.0 77 >50  A Elbow    6.8  9.8            Stim Site NR Peak (ms) Norm Peak (ms) P-T Amp (V) Site1 Site2 Delta-P (ms) Norm Delta (ms)  Left Median/Ulnar Palm Comparison (Wrist - 8cm)  32 C  Median Palm    1.4 <2.2 88.0 Median Palm Ulnar Palm 0.2   Ulnar Palm    1.6 <2.2 32.3      Right Median/Ulnar Palm Comparison (Wrist - 8cm)  32 C  Median Palm    1.4 <2.2 91.6 Median Palm Ulnar Palm 0.1   Ulnar Palm    1.3 <2.2 40.1  Electromyography   Side Muscle Ins.Act Fibs Fasc Recrt Amp Dur Poly Activation Comment  Right 1stDorInt Nml Nml Nml Nml Nml Nml Nml Nml N/A  Right Abd Poll Brev Nml Nml Nml Nml Nml Nml Nml Nml N/A  Right PronatorTeres Nml Nml Nml Nml Nml Nml Nml Nml N/A  Right Biceps Nml Nml Nml Nml Nml Nml Nml Nml N/A  Right Triceps Nml Nml Nml Nml Nml Nml Nml Nml N/A  Right Deltoid Nml Nml Nml Nml Nml Nml Nml Nml N/A  Right Infraspinatus Nml Nml Nml Nml Nml Nml Nml Nml N/A  Left PronatorTeres Nml Nml Nml Nml Nml Nml Nml Nml N/A  Left Biceps Nml Nml Nml Nml Nml Nml Nml Nml N/A  Left Triceps Nml Nml Nml Nml Nml Nml Nml Nml N/A  Left Deltoid Nml Nml Nml Nml Nml Nml Nml Nml N/A  Left Infraspinatus Nml Nml Nml Nml Nml Nml Nml Nml N/A  Left 1stDorInt Nml Nml Nml Nml Nml Nml Nml Nml N/A      Waveforms:

## 2022-01-14 ENCOUNTER — Telehealth: Payer: Self-pay

## 2022-01-14 DIAGNOSIS — R200 Anesthesia of skin: Secondary | ICD-10-CM

## 2022-01-14 DIAGNOSIS — R292 Abnormal reflex: Secondary | ICD-10-CM

## 2022-01-14 NOTE — Telephone Encounter (Signed)
Patient advised of results and referral. Per patient she goes to [facility] for her mid back already. New order added for neck.

## 2022-01-14 NOTE — Telephone Encounter (Signed)
Noted  

## 2022-01-14 NOTE — Telephone Encounter (Signed)
-----   Message from Alda Berthold, DO sent at 01/14/2022  8:56 AM EST ----- Please let pt know nerve testing of the hands was normal.  No evidence of carpal tunnel syndrome.  Based on MRI cervical spine, she does have some changes which could cause hand numbness.  I recommend that she start neck PT, if not already doing. Thanks.

## 2022-01-21 ENCOUNTER — Other Ambulatory Visit: Payer: Self-pay

## 2022-01-21 DIAGNOSIS — R292 Abnormal reflex: Secondary | ICD-10-CM

## 2022-01-21 DIAGNOSIS — R200 Anesthesia of skin: Secondary | ICD-10-CM

## 2022-01-21 DIAGNOSIS — R278 Other lack of coordination: Secondary | ICD-10-CM

## 2022-01-23 DIAGNOSIS — R2689 Other abnormalities of gait and mobility: Secondary | ICD-10-CM | POA: Diagnosis not present

## 2022-01-23 DIAGNOSIS — M545 Low back pain, unspecified: Secondary | ICD-10-CM | POA: Diagnosis not present

## 2022-01-26 DIAGNOSIS — R509 Fever, unspecified: Secondary | ICD-10-CM | POA: Diagnosis not present

## 2022-01-26 DIAGNOSIS — H6590 Unspecified nonsuppurative otitis media, unspecified ear: Secondary | ICD-10-CM | POA: Diagnosis not present

## 2022-01-26 DIAGNOSIS — Z20822 Contact with and (suspected) exposure to covid-19: Secondary | ICD-10-CM | POA: Diagnosis not present

## 2022-01-28 DIAGNOSIS — R591 Generalized enlarged lymph nodes: Secondary | ICD-10-CM | POA: Diagnosis not present

## 2022-01-28 DIAGNOSIS — J039 Acute tonsillitis, unspecified: Secondary | ICD-10-CM | POA: Diagnosis not present

## 2022-01-30 DIAGNOSIS — R2689 Other abnormalities of gait and mobility: Secondary | ICD-10-CM | POA: Diagnosis not present

## 2022-01-30 DIAGNOSIS — M545 Low back pain, unspecified: Secondary | ICD-10-CM | POA: Diagnosis not present

## 2022-02-06 DIAGNOSIS — M545 Low back pain, unspecified: Secondary | ICD-10-CM | POA: Diagnosis not present

## 2022-02-06 DIAGNOSIS — R2689 Other abnormalities of gait and mobility: Secondary | ICD-10-CM | POA: Diagnosis not present

## 2022-02-10 DIAGNOSIS — J029 Acute pharyngitis, unspecified: Secondary | ICD-10-CM | POA: Diagnosis not present

## 2022-02-10 DIAGNOSIS — R41840 Attention and concentration deficit: Secondary | ICD-10-CM | POA: Diagnosis not present

## 2022-02-10 DIAGNOSIS — R5383 Other fatigue: Secondary | ICD-10-CM | POA: Diagnosis not present

## 2022-02-10 DIAGNOSIS — I10 Essential (primary) hypertension: Secondary | ICD-10-CM | POA: Diagnosis not present

## 2022-02-13 DIAGNOSIS — R2689 Other abnormalities of gait and mobility: Secondary | ICD-10-CM | POA: Diagnosis not present

## 2022-02-13 DIAGNOSIS — M545 Low back pain, unspecified: Secondary | ICD-10-CM | POA: Diagnosis not present

## 2022-02-17 DIAGNOSIS — M542 Cervicalgia: Secondary | ICD-10-CM | POA: Diagnosis not present

## 2022-02-17 DIAGNOSIS — M6281 Muscle weakness (generalized): Secondary | ICD-10-CM | POA: Diagnosis not present

## 2022-02-17 DIAGNOSIS — R519 Headache, unspecified: Secondary | ICD-10-CM | POA: Diagnosis not present

## 2022-02-23 ENCOUNTER — Ambulatory Visit
Admission: RE | Admit: 2022-02-23 | Discharge: 2022-02-23 | Disposition: A | Discharge status: Home / Self Care | Payer: BC Managed Care – PPO | Source: Ambulatory Visit | Attending: Neurology | Admitting: Neurology

## 2022-02-23 DIAGNOSIS — R202 Paresthesia of skin: Secondary | ICD-10-CM | POA: Diagnosis not present

## 2022-02-23 DIAGNOSIS — R292 Abnormal reflex: Secondary | ICD-10-CM

## 2022-02-23 DIAGNOSIS — R200 Anesthesia of skin: Secondary | ICD-10-CM

## 2022-02-23 DIAGNOSIS — R278 Other lack of coordination: Secondary | ICD-10-CM

## 2022-02-23 MED ORDER — GADOPICLENOL 0.5 MMOL/ML IV SOLN
7.0000 mL | Freq: Once | INTRAVENOUS | Status: AC | PRN
  Administered 2022-02-23: 7 mL via INTRAVENOUS

## 2022-02-27 DIAGNOSIS — R519 Headache, unspecified: Secondary | ICD-10-CM | POA: Diagnosis not present

## 2022-02-27 DIAGNOSIS — M542 Cervicalgia: Secondary | ICD-10-CM | POA: Diagnosis not present

## 2022-02-27 DIAGNOSIS — M6281 Muscle weakness (generalized): Secondary | ICD-10-CM | POA: Diagnosis not present

## 2022-03-13 DIAGNOSIS — R519 Headache, unspecified: Secondary | ICD-10-CM | POA: Diagnosis not present

## 2022-03-13 DIAGNOSIS — M542 Cervicalgia: Secondary | ICD-10-CM | POA: Diagnosis not present

## 2022-03-13 DIAGNOSIS — M6281 Muscle weakness (generalized): Secondary | ICD-10-CM | POA: Diagnosis not present

## 2022-04-01 DIAGNOSIS — R519 Headache, unspecified: Secondary | ICD-10-CM | POA: Diagnosis not present

## 2022-04-01 DIAGNOSIS — M6281 Muscle weakness (generalized): Secondary | ICD-10-CM | POA: Diagnosis not present

## 2022-04-01 DIAGNOSIS — M542 Cervicalgia: Secondary | ICD-10-CM | POA: Diagnosis not present

## 2022-04-10 DIAGNOSIS — M542 Cervicalgia: Secondary | ICD-10-CM | POA: Diagnosis not present

## 2022-04-10 DIAGNOSIS — R519 Headache, unspecified: Secondary | ICD-10-CM | POA: Diagnosis not present

## 2022-04-10 DIAGNOSIS — M6281 Muscle weakness (generalized): Secondary | ICD-10-CM | POA: Diagnosis not present

## 2022-04-16 IMAGING — CT CT ABD-PELV W/O CM
1 of 2 series · 14 of 32 positions shown, 19 images · non-contrast
Comparison: None.

CLINICAL DATA: Intermittent left flank pain for several weeks

EXAM:
CT ABDOMEN AND PELVIS WITHOUT CONTRAST
TECHNIQUE: Multidetector CT imaging of the abdomen and pelvis was performed
following the standard protocol without IV contrast.

[Series 2: renal standard/full · axial · 0.70mm/px · z∈[-459,-69]mm · 14 of 90 slices shown, 19 images]
[im 6/90  soft-tissue]
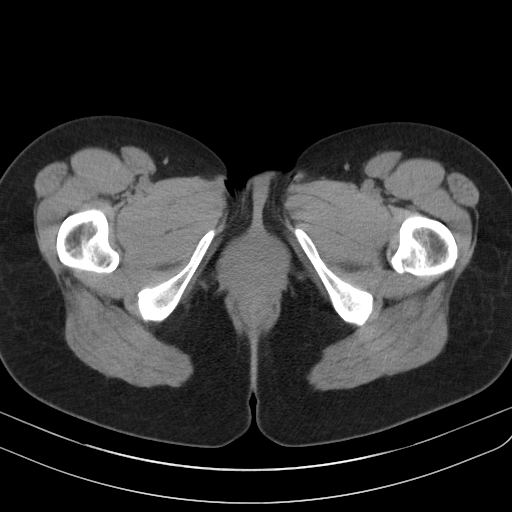
[im 6/90  bone]
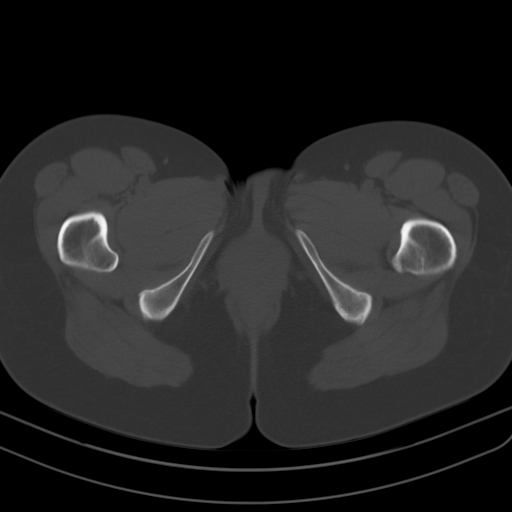
[im 11/90  soft-tissue]
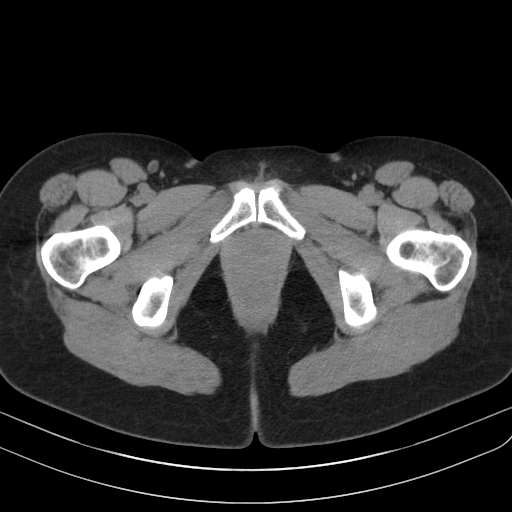
[im 21/90  soft-tissue]
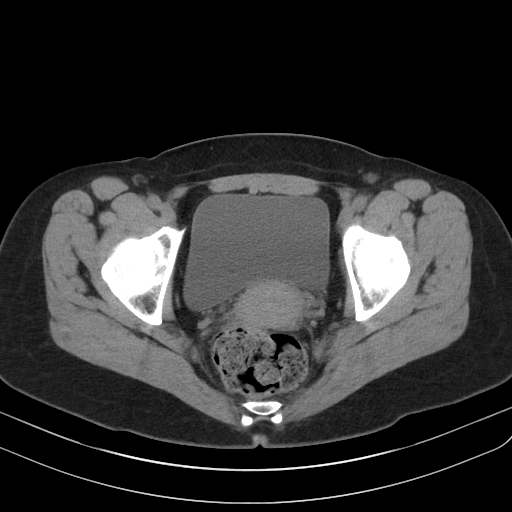
[im 27/90  soft-tissue]
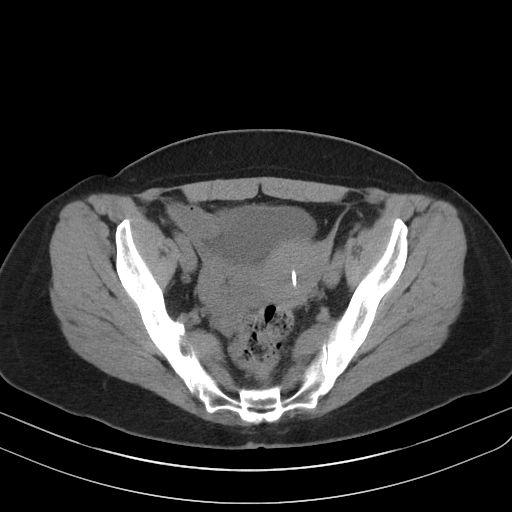
[im 32/90  soft-tissue]
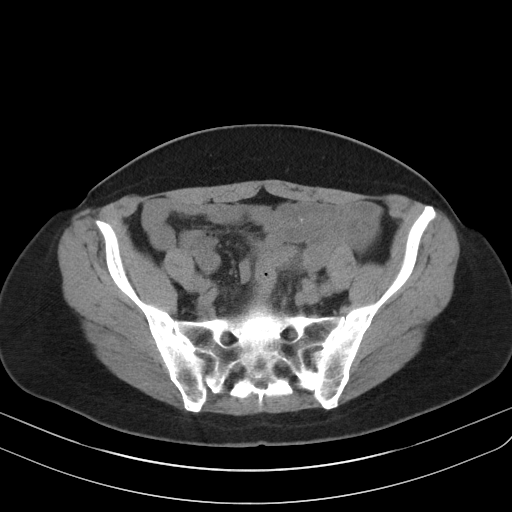
[im 37/90  soft-tissue]
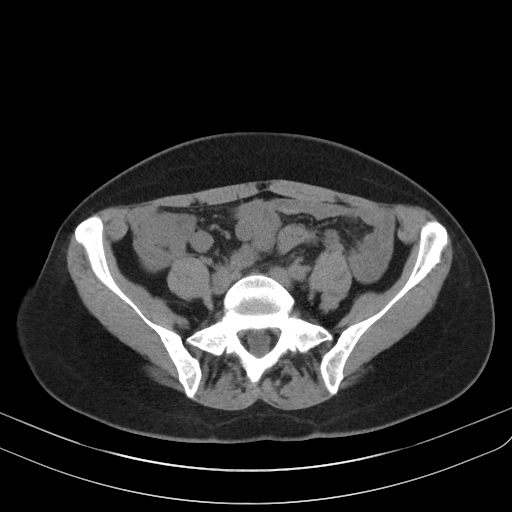
[im 48/90  soft-tissue]
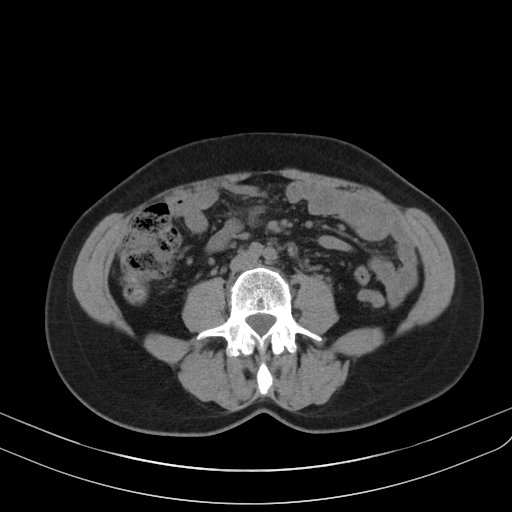
[im 53/90  soft-tissue]
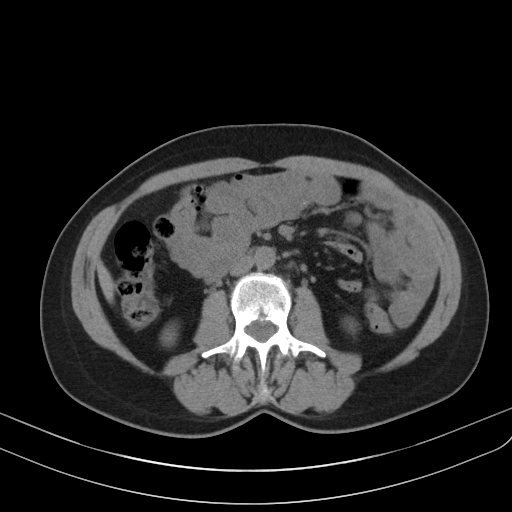
[im 58/90  soft-tissue]
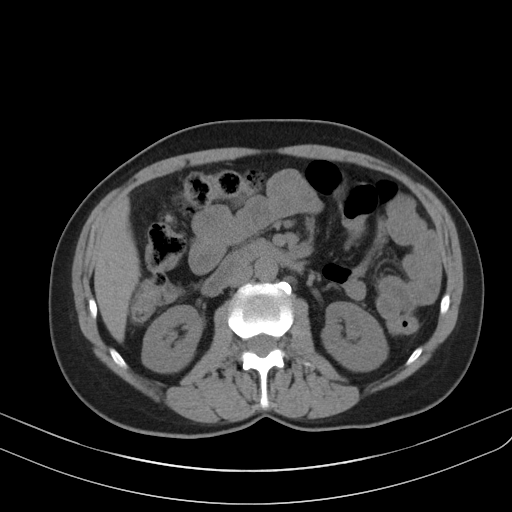
[im 58/90  bone]
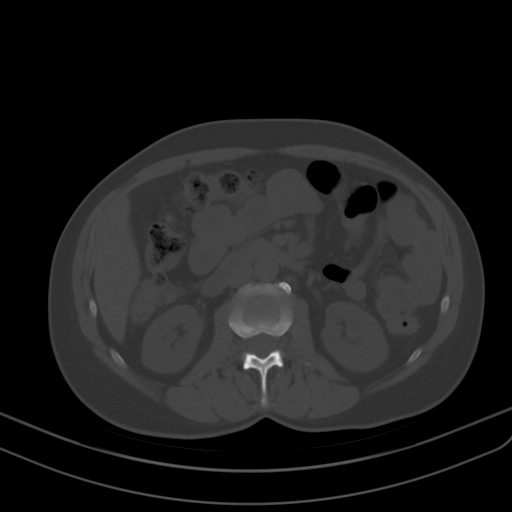
[im 63/90  soft-tissue]
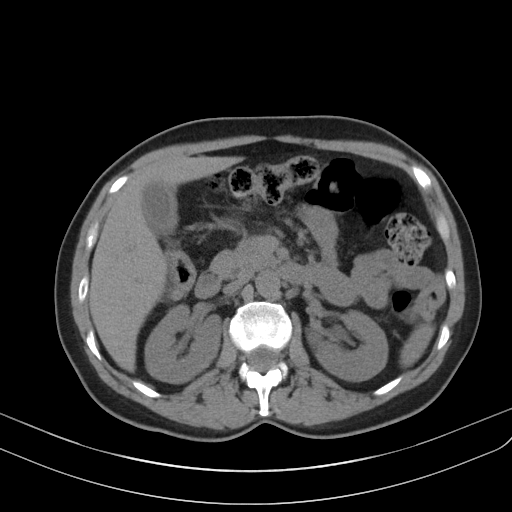
[im 69/90  soft-tissue]
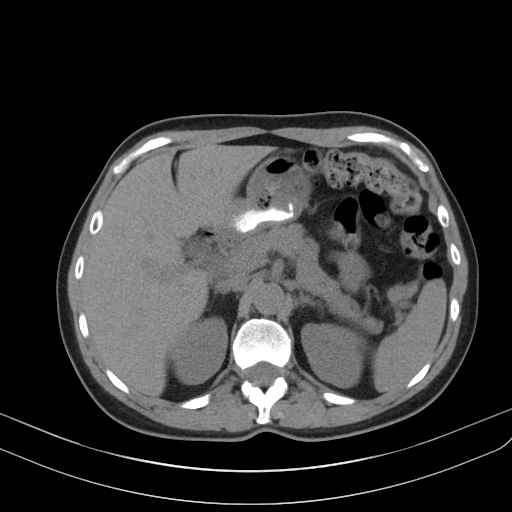
[im 69/90  lung]
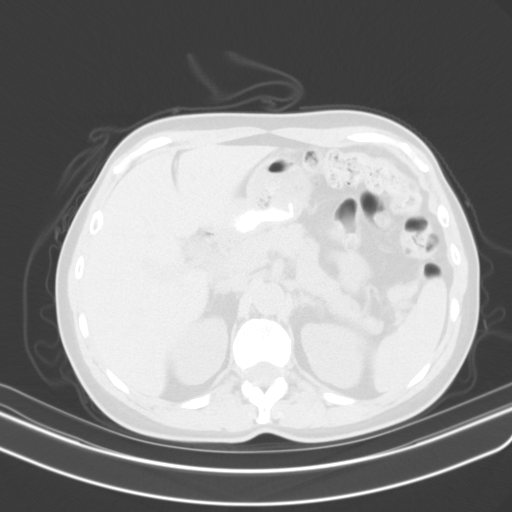
[im 74/90  lung]
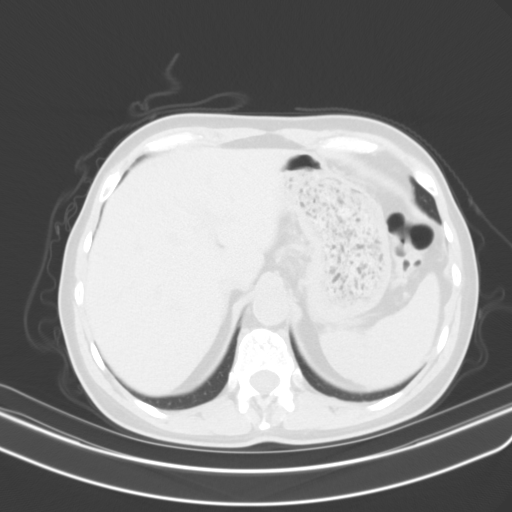
[im 79/90  soft-tissue]
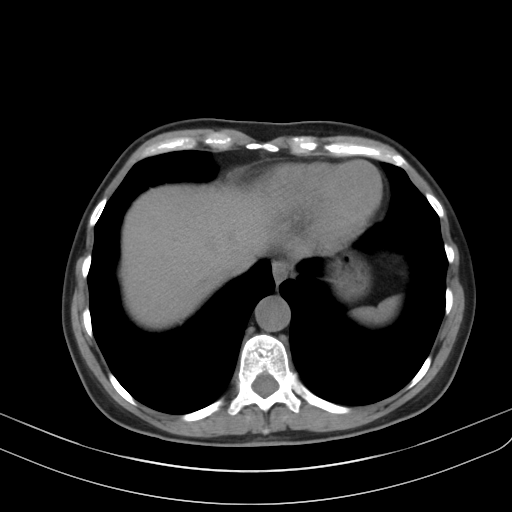
[im 79/90  lung]
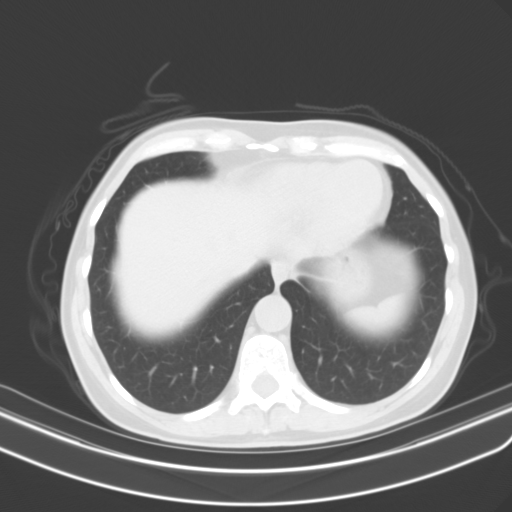
[im 84/90  soft-tissue]
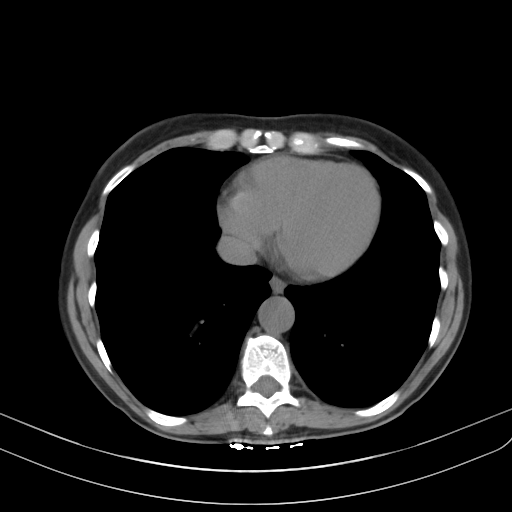
[im 84/90  lung]
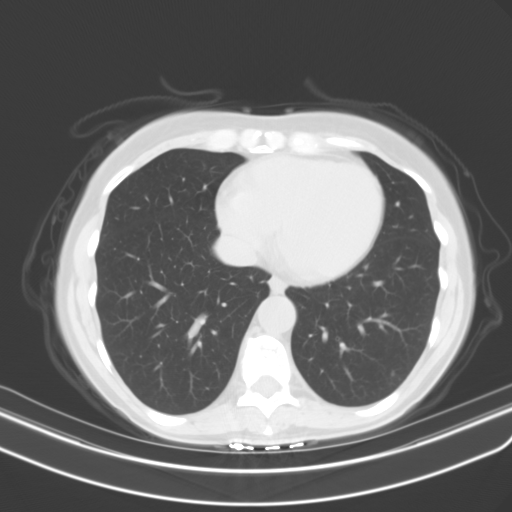

[14 of 32 positions shown; findings below may reference images not displayed]

FINDINGS: Lower chest: No acute pleural or parenchymal lung disease.

Hepatobiliary: Gallbladder wall is thickened, measuring 5 mm. No
evidence of calcified gallstones or pericholecystic fat stranding.
Liver is grossly unremarkable.

Pancreas: Unremarkable. No pancreatic ductal dilatation or
surrounding inflammatory changes.

Spleen: Normal in size without focal abnormality.

Adrenals/Urinary Tract: 1.4 cm right adrenal adenoma. Left adrenal
is unremarkable. No urinary tract calculi or obstructive uropathy
within either kidney. The bladder is unremarkable.

Stomach/Bowel: No bowel obstruction or ileus. Normal appendix right
lower quadrant. No bowel wall thickening or inflammatory change.

Vascular/Lymphatic: No significant vascular findings are present. No
enlarged abdominal or pelvic lymph nodes.

Reproductive: IUD is identified within the uterus. No adnexal
masses.

Other: No free fluid or free gas.  No abdominal wall hernia.

Musculoskeletal: No acute or destructive bony lesions. Reconstructed
images demonstrate no additional findings.
IMPRESSION: 1. Nonspecific gallbladder wall thickening, without evidence of
cholelithiasis or inflammatory changes within the pericholecystic
fat. If gallbladder pathology is suspected, correlation with right
upper quadrant ultrasound is recommended.
2. No evidence of urinary tract calculi or obstructive uropathy.
3. Otherwise no acute intra-abdominal or intrapelvic process.

## 2022-04-17 DIAGNOSIS — M542 Cervicalgia: Secondary | ICD-10-CM | POA: Diagnosis not present

## 2022-04-17 DIAGNOSIS — R519 Headache, unspecified: Secondary | ICD-10-CM | POA: Diagnosis not present

## 2022-04-17 DIAGNOSIS — M6281 Muscle weakness (generalized): Secondary | ICD-10-CM | POA: Diagnosis not present

## 2022-04-23 ENCOUNTER — Ambulatory Visit: Payer: BC Managed Care – PPO | Admitting: Neurology

## 2022-04-23 DIAGNOSIS — Z1231 Encounter for screening mammogram for malignant neoplasm of breast: Secondary | ICD-10-CM | POA: Diagnosis not present

## 2022-04-24 DIAGNOSIS — M542 Cervicalgia: Secondary | ICD-10-CM | POA: Diagnosis not present

## 2022-04-24 DIAGNOSIS — M6281 Muscle weakness (generalized): Secondary | ICD-10-CM | POA: Diagnosis not present

## 2022-04-24 DIAGNOSIS — R519 Headache, unspecified: Secondary | ICD-10-CM | POA: Diagnosis not present

## 2022-04-28 NOTE — Progress Notes (Unsigned)
Virtual Visit via Video Note  Consent was obtained for video visit:  Yes.   Answered questions that patient had about telehealth interaction:  Yes.   I discussed the limitations, risks, security and privacy concerns of performing an evaluation and management service by telemedicine. I also discussed with the patient that there may be a patient responsible charge related to this service. The patient expressed understanding and agreed to proceed.  Pt location: Home Physician Location: office Name of referring provider:  Shirlean Mylar, MD I connected with [name] at patients initiation/request on 04/29/2022 at  8:50 AM EDT by video enabled telemedicine application and verified that I am speaking with the correct person using two identifiers. Pt MRN:  [id number] Pt DOB:  05-06-1970 Video Participants:  [name]  Assessment/Plan:    Decreased grip strength - No evidence of CTS.  MRI of C-spine revealed mild cervical spinal stenosis with mild mass effect on cord but not significant that I think would cause significant symptoms or warrant surgery.  Improved with physical therapy. Migraine without aura, without status migrainosus, not intractable - stable Tension-type headache - stable   Zembrace Sym Touch as needed for acute migraines Supplements for migraine prevention: magnesium, riboflavin, CoQ10 Monitor grip strength or any new weakness. Follow up in one year or as needed.   Subjective:  Rebecca Martinez is a 52 year old right-handed female with hypertension and migraines who follows up for headaches for clumsiness.  MRI of brain and cervical spine personally reviewed.  UPDATE: To further evaluated clumsiness, MRI of cervical spine was performed which revealed chronic degenerative disc and endplate disease at C5-6 and C6-7 with mild multifactorial spinal stenosis and mild cord mass effect at C5-6 without cord signal abnormality and moderate neural foraminal stenosis  bilaterally at C6 and left at C4.  NCV-EMG of upper extremities on 01/13/2022 was normal.  She was referred for neck PT.  She has had improvement in her grip strength.  MRI of brain with and without contrast on 02/23/2022 was normal.    Intensity:  moderate Duration:  within 30 minutes with Zembrace (had a sample) Frequency:  no migraines in last 6 months.   May have a mild tension-type headache a couple of times a month (varies based on stress). Current NSAIDS:  ibuprofen Current analgesics:  Tylelnol Current triptans:  Zembrace Current ergotamine:  none Current anti-emetic:  Zofran ODT  Current muscle relaxants:  none Current anti-anxiolytic:  none Current sleep aide:  none Current Antihypertensive medications:  HCTZ Current Antidepressant medications:  none Current Anticonvulsant medications:  topiramate  at bedtime Current anti-CGRP:  none Current Vitamins/Herbal/Supplements:  Magnesium citrate , riboflavin  daily, CoQ10  daily Current Antihistamines/Decongestants:  none Other therapy:  none Hormone/birth control:  Mirena   HISTORY: For a while, she reports some clumsiness in her hands.  She will often drop objects.  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  In April, she had two injuries during a mud race in which she slid down and hill and later fell off the monkey bars and fractured her right hand.  She has been experiencing low back pain that will spasm and sometimes radiate down both legs.  She went to physical therapy and she was noted to exhibit right Hoffman's sign and one beat of clonus bilaterally on exam.    She has had migraines since her early 67s and would have one a year.  They have become frequent over the  past year.  She has severe left frontal surging pain with nausea, vomiting, photophobia, phonophobia, and osmophobia.  No visual aura.  They typically cannot abort and requires treatment in the ED.  She has required evaluation and  treatment in the ED several times over the past year.  They have been occurring once every 3 to 4 weeks.  She has also had mild headaches that may last several days in a row.  She thinks triggers may be menopause and increased emotional stress.  Her blood pressure has been elevated, requiring increase in her HCTZ.   Past NSAIDS:  naproxen Past analgesics:  Tylenol, Excedrin Past abortive triptans:  Sumatriptan Lowry (effective in the past many years ago) Past abortive ergotamine:  none Past muscle relaxants:  none Past anti-emetic:  promethazine Past antihypertensive medications:  none Past antidepressant medications:  venlafaxine Past anticonvulsant medications:  none Past anti-CGRP:  none Past vitamins/Herbal/Supplements:  none Past antihistamines/decongestants:  none Other past therapies:  none     Family history of headache:  No.  Mother had TIAs at age 38.    Past Medical History: Past Medical History:  Diagnosis Date   Eczema    Hypertension    Migraine     Medications: Outpatient Encounter Medications as of 04/29/2022  Medication Sig   acetaminophen (TYLENOL) 325 MG tablet Take 650 mg by mouth every 6 (six) hours as needed for pain. (Patient not taking: Reported on 11/11/2021)   albuterol (VENTOLIN HFA) 108 (90 Base) MCG/ACT inhaler Inhale 2 puffs into the lungs every 4 (four) hours as needed for wheezing or shortness of breath (coughing).   B-COMPLEX-C PO Take by mouth.   co-enzyme Q-10 30 MG capsule Take 30 mg by mouth 3 (three) times daily.   EPINEPHrine (EPI-PEN) 0.3 mg/0.3 mL SOAJ injection Inject 0.3 mg into the muscle once.   hydrochlorothiazide (HYDRODIURIL) 25 MG tablet Take 12.5 mg by mouth daily.    ibuprofen (ADVIL,MOTRIN) 200 MG tablet Take 400 mg by mouth every 6 (six) hours as needed for pain.   levonorgestrel (MIRENA) 20 MCG/24HR IUD 1 each by Intrauterine route once.   magnesium 30 MG tablet Take 30 mg by mouth once.   ondansetron (ZOFRAN ODT) 4 MG  disintegrating tablet Take 1 tablet (4 mg total) by mouth every 8 (eight) hours as needed for nausea or vomiting.   potassium chloride (KLOR-CON) 20 MEQ packet Take by mouth 2 (two) times daily.   SUMAtriptan Succinate (ZEMBRACE SYMTOUCH) 3 MG/0.5ML SOAJ Inject 3 mg into the skin as needed (May repeat after 1 hour.  Maximum 2 injections in 24 hours.).   topiramate (TOPAMAX) 25 MG tablet Take 1 tablet (25 mg total) by mouth at bedtime. (Patient not taking: Reported on 04/09/2020)   venlafaxine XR (EFFEXOR-XR) 37.5 MG 24 hr capsule Take 37.5 mg by mouth daily with breakfast. (Patient not taking: Reported on 11/11/2021)   No facility-administered encounter medications on file as of 04/29/2022.    Allergies: Allergies  Allergen Reactions   Shellfish Allergy Nausea And Vomiting   Codeine Nausea Only   Penicillins Other (See Comments)    unknown   Milk-Related Compounds Rash   Other Rash    Some fruits    Family History: Family History  Problem Relation Age of Onset   Allergic rhinitis Mother    Allergic rhinitis Son    Allergic rhinitis Daughter    Allergic rhinitis Daughter    Immunodeficiency Neg Hx    Urticaria Neg Hx  Eczema Neg Hx    Angioedema Neg Hx    Asthma Neg Hx    Atopy Neg Hx     Observations/Objective:   No acute distress.  Alert and oriented.  Speech fluent and not dysarthric.  Language intact.    Follow Up Instructions:    -I discussed the assessment and treatment plan with the patient. The patient was provided an opportunity to ask questions and all were answered. The patient agreed with the plan and demonstrated an understanding of the instructions.   The patient was advised to call back or seek an in-person evaluation if the symptoms worsen or if the condition fails to improve as anticipated.   Cira Servant, DO

## 2022-04-29 ENCOUNTER — Telehealth (INDEPENDENT_AMBULATORY_CARE_PROVIDER_SITE_OTHER): Payer: BC Managed Care – PPO | Admitting: Neurology

## 2022-04-29 ENCOUNTER — Encounter: Payer: Self-pay | Admitting: Neurology

## 2022-04-29 DIAGNOSIS — G44219 Episodic tension-type headache, not intractable: Secondary | ICD-10-CM

## 2022-04-29 DIAGNOSIS — M4802 Spinal stenosis, cervical region: Secondary | ICD-10-CM

## 2022-04-29 DIAGNOSIS — G43009 Migraine without aura, not intractable, without status migrainosus: Secondary | ICD-10-CM

## 2022-04-29 NOTE — Patient Instructions (Signed)
Monitor for any changes in strength Zembrace shot as needed Continue supplements Otherwise follow up 1 year

## 2022-07-17 DIAGNOSIS — Z23 Encounter for immunization: Secondary | ICD-10-CM | POA: Diagnosis not present

## 2022-07-17 DIAGNOSIS — G43009 Migraine without aura, not intractable, without status migrainosus: Secondary | ICD-10-CM | POA: Diagnosis not present

## 2022-07-17 DIAGNOSIS — I10 Essential (primary) hypertension: Secondary | ICD-10-CM | POA: Diagnosis not present

## 2022-07-17 DIAGNOSIS — R4589 Other symptoms and signs involving emotional state: Secondary | ICD-10-CM | POA: Diagnosis not present

## 2022-07-17 DIAGNOSIS — Z0000 Encounter for general adult medical examination without abnormal findings: Secondary | ICD-10-CM | POA: Diagnosis not present

## 2022-07-17 DIAGNOSIS — E785 Hyperlipidemia, unspecified: Secondary | ICD-10-CM | POA: Diagnosis not present

## 2022-07-17 DIAGNOSIS — E559 Vitamin D deficiency, unspecified: Secondary | ICD-10-CM | POA: Diagnosis not present

## 2022-07-17 DIAGNOSIS — R41840 Attention and concentration deficit: Secondary | ICD-10-CM | POA: Diagnosis not present

## 2022-07-21 DIAGNOSIS — Z30431 Encounter for routine checking of intrauterine contraceptive device: Secondary | ICD-10-CM | POA: Diagnosis not present

## 2022-07-21 DIAGNOSIS — Z01419 Encounter for gynecological examination (general) (routine) without abnormal findings: Secondary | ICD-10-CM | POA: Diagnosis not present

## 2022-07-21 DIAGNOSIS — Z114 Encounter for screening for human immunodeficiency virus [HIV]: Secondary | ICD-10-CM | POA: Diagnosis not present

## 2022-07-21 DIAGNOSIS — Z1159 Encounter for screening for other viral diseases: Secondary | ICD-10-CM | POA: Diagnosis not present

## 2022-07-21 DIAGNOSIS — Z113 Encounter for screening for infections with a predominantly sexual mode of transmission: Secondary | ICD-10-CM | POA: Diagnosis not present

## 2022-08-17 DIAGNOSIS — R6889 Other general symptoms and signs: Secondary | ICD-10-CM | POA: Diagnosis not present

## 2022-08-17 DIAGNOSIS — I493 Ventricular premature depolarization: Secondary | ICD-10-CM | POA: Diagnosis not present

## 2022-08-20 ENCOUNTER — Ambulatory Visit (INDEPENDENT_AMBULATORY_CARE_PROVIDER_SITE_OTHER): Payer: BC Managed Care – PPO

## 2022-08-20 ENCOUNTER — Ambulatory Visit: Payer: BC Managed Care – PPO | Attending: Internal Medicine | Admitting: Internal Medicine

## 2022-08-20 ENCOUNTER — Encounter: Payer: Self-pay | Admitting: Internal Medicine

## 2022-08-20 VITALS — BP 140/90 | HR 66 | Ht 66.5 in | Wt 157.0 lb

## 2022-08-20 DIAGNOSIS — R9431 Abnormal electrocardiogram [ECG] [EKG]: Secondary | ICD-10-CM

## 2022-08-20 DIAGNOSIS — R002 Palpitations: Secondary | ICD-10-CM

## 2022-08-20 DIAGNOSIS — R42 Dizziness and giddiness: Secondary | ICD-10-CM

## 2022-08-20 DIAGNOSIS — R55 Syncope and collapse: Secondary | ICD-10-CM | POA: Diagnosis not present

## 2022-08-20 NOTE — Patient Instructions (Signed)
Medication Instructions:  Your physician recommends that you continue on your current medications as directed. Please refer to the Current Medication list given to you today.  *If you need a refill on your cardiac medications before your next appointment, please call your pharmacy*   Lab Work: None needed  If you have labs (blood work) drawn today and your tests are completely normal, you will receive your results only by: MyChart Message (if you have MyChart) OR A paper copy in the mail If you have any lab test that is abnormal or we need to change your treatment, we will call you to review the results.   Testing/Procedures: Rebecca Martinez- Long Term Monitor Instructions  Your physician has requested you wear a ZIO patch monitor for 3 days.  This is a single patch monitor. [employer] supplies one patch monitor per enrollment. Additional stickers are not available. Please do not apply patch if you will be having a Nuclear Stress Test,  Echocardiogram, Cardiac CT, MRI, or Chest Xray during the period you would be wearing the  monitor. The patch cannot be worn during these tests. You cannot remove and re-apply the  ZIO XT patch monitor.  Your ZIO patch monitor will be mailed 3 day USPS to your address on file. It may take 3-5 days  to receive your monitor after you have been enrolled.  Once you have received your monitor, please review the enclosed instructions. Your monitor  has already been registered assigning a specific monitor serial # to you.  Billing and Patient Assistance Program Information  We have supplied [employer] with any of your insurance information on file for billing purposes. [employer] offers a sliding scale Patient Assistance Program for patients that do not have  insurance, or whose insurance does not completely cover the cost of the ZIO monitor.  You must apply for the Patient Assistance Program to qualify for this discounted rate.  To apply, please call [employer] at  [phone], select option 4, select option 2, ask to apply for  Patient Assistance Program. Meredeth Ide will ask your household income, and how many people  are in your household. They will quote your out-of-pocket cost based on that information.  [employer] will also be able to set up a 71-month, interest-free payment plan if needed.  Applying the monitor   Shave hair from upper left chest.  Hold abrader disc by orange tab. Rub abrader in 40 strokes over the upper left chest as  indicated in your monitor instructions.  Clean area with 4 enclosed alcohol pads. Let dry.  Apply patch as indicated in monitor instructions. Patch will be placed under collarbone on left  side of chest with arrow pointing upward.  Rub patch adhesive wings for 2 minutes. Remove white label marked "1". Remove the white  label marked "2". Rub patch adhesive wings for 2 additional minutes.  While looking in a mirror, press and release button in center of patch. A small green light will  flash 3-4 times. This will be your only indicator that the monitor has been turned on.  Do not shower for the first 24 hours. You may shower after the first 24 hours.  Press the button if you feel a symptom. You will hear a small click. Record Date, Time and  Symptom in the Patient Logbook.  When you are ready to remove the patch, follow instructions on the last 2 pages of Patient  Logbook. Stick patch monitor onto the last page of Patient Logbook.  Place Patient  Logbook in the blue and white box. Use locking tab on box and tape box closed  securely. The blue and white box has prepaid postage on it. Please place it in the mailbox as  soon as possible. Your physician should have your test results approximately 7 days after the  monitor has been mailed back to [facility].  Call [facility] Customer Care at [phone] if you have questions regarding  your ZIO XT patch monitor. Call them immediately if you see an orange light  blinking on your  monitor.  If your monitor falls off in less than 4 days, contact our Monitor department at [phone].  If your monitor becomes loose or falls off after 4 days call [employer] at [phone] for  suggestions on securing your monitor.  Your physician has requested that you have an exercise tolerance test. For further information please visit [email or web address] Please also follow instruction sheet, as given.    Follow-Up: At [facility], you and your health needs are our priority.  As part of our continuing mission to provide you with exceptional heart care, we have created designated Provider Care Teams.  These Care Teams include your primary Cardiologist (physician) and Advanced Practice Providers (APPs -  Physician Assistants and Nurse Practitioners) who all work together to provide you with the care you need, when you need it.  We recommend signing up for the patient portal called "MyChart".  Sign up information is provided on this After Visit Summary.  MyChart is used to connect with patients for Virtual Visits (Telemedicine).  Patients are able to view lab/test results, encounter notes, upcoming appointments, etc.  Non-urgent messages can be sent to your provider as well.   To learn more about what you can do with MyChart, go to [email or web address].    Your next appointment:   3 month(s)  Provider:   Maisie Fus, MD

## 2022-08-20 NOTE — Progress Notes (Signed)
Cardiology Office Note:    Date:  08/20/2022   ID:  Rebecca Martinez, DOB [birth year], MRN [id number]  PCP:  Camie Patience, FNP   [facility] Providers Cardiologist:  None     Referring MD: Camie Patience, FNP   No chief complaint on file. Presyncope  History of Present Illness:    Rebecca Martinez is a 52 y.o. female   Rebecca Martinez presents with a chief complaint of almost passing out during a recent [place] race. She reports that she has participated in close to a hundred similar races, including events with up to 32 miles and 70 obstacles, without experiencing this issue before. During the race, the patient felt their heart racing and experienced tingling in their fingers and lips. She required medical assistance, and their blood pressure and heart rate were found to be elevated at the time.She  denies any history of loss of consciousness.  She  has a family history of heart issues, including a maternal grandfather with a defibrillator, ?CABG, and congestive heart failure, and a paternal grandfather who died from a heart attack in his mid-40s. She has been on blood pressure medication since the age of 88. She reports that her blood pressure is usually normal when not engaging in physical activity, with recent readings of 125/72 and 120/82. Marland Kitchen  EKG shows sinus rhythm with RVOT PVCs. Qtc 447 ms  Past Medical History:  Diagnosis Date   Eczema    Hypertension    Migraine     Past Surgical History:  Procedure Laterality Date   NASAL SINUS SURGERY     TEMPOROMANDIBULAR JOINT ARTHROPLASTY      Current Medications: Current Meds  Medication Sig   acetaminophen (TYLENOL) 325 MG tablet Take 650 mg by mouth every 6 (six) hours as needed for pain.   albuterol (VENTOLIN HFA) 108 (90 Base) MCG/ACT inhaler Inhale 2 puffs into the lungs every 4 (four) hours as needed for wheezing or shortness of breath (coughing).   B-COMPLEX-C PO Take by mouth.   co-enzyme Q-10 30 MG  capsule Take 30 mg by mouth 3 (three) times daily.   EPINEPHrine (EPI-PEN) 0.3 mg/0.3 mL SOAJ injection Inject 0.3 mg into the muscle once.   hydrochlorothiazide (HYDRODIURIL) 25 MG tablet Take 25 mg by mouth daily.   ibuprofen (ADVIL,MOTRIN) 200 MG tablet Take 400 mg by mouth every 6 (six) hours as needed for pain.   levonorgestrel (MIRENA) 20 MCG/24HR IUD 1 each by Intrauterine route once.   magnesium 30 MG tablet Take 30 mg by mouth once.   ondansetron (ZOFRAN ODT) 4 MG disintegrating tablet Take 1 tablet (4 mg total) by mouth every 8 (eight) hours as needed for nausea or vomiting.   potassium chloride (KLOR-CON) 20 MEQ packet Take by mouth 2 (two) times daily.   SUMAtriptan Succinate (ZEMBRACE SYMTOUCH) 3 MG/0.5ML SOAJ Inject 3 mg into the skin as needed (May repeat after 1 hour.  Maximum 2 injections in 24 hours.).   topiramate (TOPAMAX) 25 MG tablet Take 1 tablet (25 mg total) by mouth at bedtime.     Allergies:   Shellfish allergy, Codeine, Penicillins, Milk-related compounds, and Other   Social History   Socioeconomic History   Marital status: Married    Spouse name: Not on file   Number of children: Not on file   Years of education: Not on file   Highest education level: Not on file  Occupational History   Not on file  Tobacco Use  Smoking status: Never   Smokeless tobacco: Never  Vaping Use   Vaping status: Never Used  Substance and Sexual Activity   Alcohol use: Yes    Comment: occ   Drug use: No   Sexual activity: Not on file  Other Topics Concern   Not on file  Social History Narrative   Right handed   Lives with husband in one story home   Drinks caffeine   Social Determinants of Health   Financial Resource Strain: Not on file  Food Insecurity: Not on file  Transportation Needs: Not on file  Physical Activity: Not on file  Stress: Not on file  Social Connections: Unknown (05/24/2021)   Received [facility]   Social Network     Social Network: Not on file     Family History: The patient's family history includes Allergic rhinitis in her daughter, daughter, mother, and son. There is no history of Immunodeficiency, Urticaria, Eczema, Angioedema, Asthma, or Atopy.  ROS:   Please see the history of present illness.     All other systems reviewed and are negative.  EKGs/Labs/Other Studies Reviewed:    The following studies were reviewed today: EKG Interpretation Date/Time:  Thursday August 20 2022 09:39:16 EDT Ventricular Rate:  66 PR Interval:  182 QRS Duration:  78 QT Interval:  454 QTC Calculation: 475 R Axis:   43  Text Interpretation: Sinus bradycardia with occasional Premature ventricular complexes When compared with ECG of 05-Sep-2012 10:23, Premature ventricular complexes are now Present Confirmed by Carolan Clines ([id number]) on 08/20/2022 10:32:32 AM       Recent Labs: No results found for requested labs within last 365 days.  Recent Lipid Panel No results found for: "CHOL", "TRIG", "HDL", "CHOLHDL", "VLDL", "LDLCALC", "LDLDIRECT"   Risk Assessment/Calculations:     Physical Exam:    VS:  Ht 5' 6.5" (1.689 m)   BMI 24.96 kg/m    Vitals:   08/20/22 0911  BP: (!) 140/90  Pulse: 66    Wt Readings from Last 3 Encounters:  11/11/21 157 lb (71.2 kg)  04/22/21 157 lb (71.2 kg)  04/09/20 140 lb (63.5 kg)     GEN:  Well nourished, well developed in no acute distress HEENT: Normal NECK: No JVD CARDIAC: RRR, no murmurs, rubs, gallops RESPIRATORY:  Clear to auscultation without rales, wheezing or rhonchi  ABDOMEN: Soft, non-tender, non-distended MUSCULOSKELETAL:  No edema; No deformity  SKIN: Warm and dry NEUROLOGIC:  Alert and oriented x 3 PSYCHIATRIC:  Normal affect   ASSESSMENT:   Exercise intolerance/ Presyncope -so far no red flags. Will do a work up HTN PLAN:    In order of problems listed above:  POET 3 day ziopatch If any abnormalities will get TTE and do further testing BP  is well controlled at home Follow up in 3 months  Medication Adjustments/Labs and Tests Ordered: Current medicines are reviewed at length with the patient today.  Concerns regarding medicines are outlined above.  No orders of the defined types were placed in this encounter.  No orders of the defined types were placed in this encounter.   There are no Patient Instructions on file for this visit.   Signed, Maisie Fus, MD  08/20/2022 9:12 AM    [facility]

## 2022-08-20 NOTE — Progress Notes (Unsigned)
Enrolled for [employer] to mail a ZIO XT long term holter monitor to the patients address on file.  

## 2022-08-22 DIAGNOSIS — R9431 Abnormal electrocardiogram [ECG] [EKG]: Secondary | ICD-10-CM

## 2022-08-22 DIAGNOSIS — R55 Syncope and collapse: Secondary | ICD-10-CM | POA: Diagnosis not present

## 2022-08-22 DIAGNOSIS — R42 Dizziness and giddiness: Secondary | ICD-10-CM | POA: Diagnosis not present

## 2022-08-26 ENCOUNTER — Telehealth (HOSPITAL_COMMUNITY): Payer: Self-pay | Admitting: *Deleted

## 2022-08-26 NOTE — Telephone Encounter (Signed)
Pt reached and given instructions for ETT.

## 2022-08-31 DIAGNOSIS — I493 Ventricular premature depolarization: Secondary | ICD-10-CM | POA: Diagnosis not present

## 2022-08-31 DIAGNOSIS — J45990 Exercise induced bronchospasm: Secondary | ICD-10-CM | POA: Diagnosis not present

## 2022-08-31 DIAGNOSIS — S80861A Insect bite (nonvenomous), right lower leg, initial encounter: Secondary | ICD-10-CM | POA: Diagnosis not present

## 2022-08-31 DIAGNOSIS — F419 Anxiety disorder, unspecified: Secondary | ICD-10-CM | POA: Diagnosis not present

## 2022-09-03 ENCOUNTER — Ambulatory Visit (HOSPITAL_COMMUNITY): Payer: BC Managed Care – PPO | Attending: Internal Medicine

## 2022-09-03 DIAGNOSIS — R42 Dizziness and giddiness: Secondary | ICD-10-CM | POA: Diagnosis not present

## 2022-09-03 DIAGNOSIS — R9431 Abnormal electrocardiogram [ECG] [EKG]: Secondary | ICD-10-CM | POA: Insufficient documentation

## 2022-09-03 DIAGNOSIS — R55 Syncope and collapse: Secondary | ICD-10-CM | POA: Diagnosis not present

## 2022-09-03 LAB — EXERCISE TOLERANCE TEST
Angina Index: 0
Duke Treadmill Score: 4
Estimated workload: 17.2
Exercise duration (min): 14 min
Exercise duration (sec): 2 s
MPHR: 166 {beats}/min
Peak HR: 176 {beats}/min
Percent HR: 106 %
Rest HR: 65 {beats}/min
ST Depression (mm): 2 mm

## 2022-09-07 ENCOUNTER — Other Ambulatory Visit: Payer: Self-pay | Admitting: Internal Medicine

## 2022-09-07 ENCOUNTER — Telehealth: Payer: Self-pay | Admitting: Internal Medicine

## 2022-09-07 DIAGNOSIS — R9430 Abnormal result of cardiovascular function study, unspecified: Secondary | ICD-10-CM

## 2022-09-07 DIAGNOSIS — I493 Ventricular premature depolarization: Secondary | ICD-10-CM

## 2022-09-07 MED ORDER — METOPROLOL SUCCINATE ER 25 MG PO TB24: 25.0000 mg | ORAL_TABLET | Freq: Every day | ORAL | 2 refills | Status: DC

## 2022-09-07 NOTE — H&P (View-Only) (Signed)
Called Mrs. Rebecca Martinez and reviewed her stress test. Did have ST depression inferolaterally and frequent PVC/RVOT. She did well with the study, however was having symptoms after a race. Plan is for an echocardiogram. Can be false positive ETT with low risk factors and great exercise capacity; however with prior symptoms will plan for LHC.

## 2022-09-07 NOTE — Telephone Encounter (Signed)
  Shared Decision Making/Informed Consent The risks [stroke (1 in 1000), death (1 in 1000), kidney failure [usually temporary] (1 in 500), bleeding (1 in 200), allergic reaction [possibly serious] (1 in 200)], benefits (diagnostic support and management of coronary artery disease) and alternatives of a cardiac catheterization were discussed in detail with Rebecca Martinez and she is willing to proceed.

## 2022-09-07 NOTE — Progress Notes (Signed)
Called Mrs. Rebecca Martinez and reviewed her stress test. Did have ST depression inferolaterally and frequent PVC/RVOT. She did well with the study, however was having symptoms after a race. Plan is for an echocardiogram. Can be false positive ETT with low risk factors and great exercise capacity; however with prior symptoms will plan for LHC.

## 2022-09-08 ENCOUNTER — Other Ambulatory Visit: Payer: Self-pay

## 2022-09-08 DIAGNOSIS — R9430 Abnormal result of cardiovascular function study, unspecified: Secondary | ICD-10-CM

## 2022-09-08 NOTE — Addendum Note (Signed)
Addended by: Bernita Buffy on: 09/08/2022 05:03 PM   Modules accepted: Orders

## 2022-09-08 NOTE — Telephone Encounter (Addendum)
Spoke with pt regarding scheduling of left heart cath. Pt has discussed with Dr. Wyline Mood and has been consented. Pt also needs to have echocardiogram done. Will send this over to scheduling.   Called cath lab. Pt is scheduled for Thursday, 8/29 at 9am with Dr. [place]. Arrival time is 7am.   Spoke with pt with scheduled date and time. Instructions reviewed with pt. Pt will plan to have labs drawn tomorrow at [facility] office. Pt does not take an aspirin daily, will provide pt with a bottle of aspirin. She will take one 81mg  aspirin on the day of her procedure. Lab orders placed. Instructions also sent via mychart. Pt verbalizes understanding.

## 2022-09-09 DIAGNOSIS — R9430 Abnormal result of cardiovascular function study, unspecified: Secondary | ICD-10-CM | POA: Diagnosis not present

## 2022-09-10 ENCOUNTER — Encounter (HOSPITAL_COMMUNITY)
Admission: RE | Disposition: A | Discharge status: Home / Self Care | Payer: Self-pay | Source: Ambulatory Visit | Attending: Cardiology

## 2022-09-10 ENCOUNTER — Ambulatory Visit (HOSPITAL_COMMUNITY)
Admission: RE | Admit: 2022-09-10 | Discharge: 2022-09-10 | Disposition: A | Discharge status: Home / Self Care | Payer: BC Managed Care – PPO | Source: Ambulatory Visit | Attending: Cardiology | Admitting: Cardiology

## 2022-09-10 DIAGNOSIS — Z01818 Encounter for other preprocedural examination: Secondary | ICD-10-CM

## 2022-09-10 DIAGNOSIS — R9430 Abnormal result of cardiovascular function study, unspecified: Secondary | ICD-10-CM

## 2022-09-10 DIAGNOSIS — R9439 Abnormal result of other cardiovascular function study: Secondary | ICD-10-CM | POA: Insufficient documentation

## 2022-09-10 DIAGNOSIS — I10 Essential (primary) hypertension: Secondary | ICD-10-CM | POA: Diagnosis not present

## 2022-09-10 DIAGNOSIS — R55 Syncope and collapse: Secondary | ICD-10-CM | POA: Diagnosis not present

## 2022-09-10 DIAGNOSIS — Z8249 Family history of ischemic heart disease and other diseases of the circulatory system: Secondary | ICD-10-CM | POA: Diagnosis not present

## 2022-09-10 HISTORY — PX: LEFT HEART CATH AND CORONARY ANGIOGRAPHY: CATH118249

## 2022-09-10 LAB — BASIC METABOLIC PANEL
BUN/Creatinine Ratio: 20 (ref 9–23)
BUN: 18 mg/dL (ref 6–24)
CO2: 26 mmol/L (ref 20–29)
Calcium: 9.3 mg/dL (ref 8.7–10.2)
Chloride: 100 mmol/L (ref 96–106)
Creatinine, Ser: 0.9 mg/dL (ref 0.57–1.00)
Glucose: 96 mg/dL (ref 70–99)
Potassium: 3.7 mmol/L (ref 3.5–5.2)
Sodium: 141 mmol/L (ref 134–144)
eGFR: 76 mL/min/{1.73_m2} (ref >59)

## 2022-09-10 LAB — CBC
Hematocrit: 42.4 % (ref 34.0–46.6)
Hemoglobin: 13.7 g/dL (ref 11.1–15.9)
MCH: 27.6 pg (ref 26.6–33.0)
MCHC: 32.3 g/dL (ref 31.5–35.7)
MCV: 85 fL (ref 79–97)
Platelets: 264 10*3/uL (ref 150–450)
RBC: 4.97 x10E6/uL (ref 3.77–5.28)
RDW: 13.1 % (ref 11.7–15.4)
WBC: 7.4 10*3/uL (ref 3.4–10.8)

## 2022-09-10 LAB — PREGNANCY, URINE: Preg Test, Ur: NEGATIVE

## 2022-09-10 SURGERY — LEFT HEART CATH AND CORONARY ANGIOGRAPHY
Anesthesia: LOCAL

## 2022-09-10 MED ORDER — HEPARIN SODIUM (PORCINE) 1000 UNIT/ML IJ SOLN
INTRAMUSCULAR | Status: AC
  Filled 2022-09-10: qty 10

## 2022-09-10 MED ORDER — FENTANYL CITRATE (PF) 100 MCG/2ML IJ SOLN
INTRAMUSCULAR | Status: AC
  Filled 2022-09-10: qty 2

## 2022-09-10 MED ORDER — LABETALOL HCL 5 MG/ML IV SOLN: 10.0000 mg | INTRAVENOUS | Status: DC | PRN

## 2022-09-10 MED ORDER — SODIUM CHLORIDE 0.9% FLUSH: 3.0000 mL | Freq: Two times a day (BID) | INTRAVENOUS | Status: DC

## 2022-09-10 MED ORDER — ACETAMINOPHEN 325 MG PO TABS: 650.0000 mg | ORAL_TABLET | ORAL | Status: DC | PRN

## 2022-09-10 MED ORDER — HEPARIN (PORCINE) IN NACL 1000-0.9 UT/500ML-% IV SOLN
INTRAVENOUS | Status: DC | PRN
  Administered 2022-09-10: 500 mL

## 2022-09-10 MED ORDER — ASPIRIN 81 MG PO CHEW: 81.0000 mg | CHEWABLE_TABLET | ORAL | Status: DC

## 2022-09-10 MED ORDER — HEPARIN SODIUM (PORCINE) 1000 UNIT/ML IJ SOLN
INTRAMUSCULAR | Status: DC | PRN
  Administered 2022-09-10: 3500 [IU] via INTRAVENOUS

## 2022-09-10 MED ORDER — VERAPAMIL HCL 2.5 MG/ML IV SOLN
INTRAVENOUS | Status: DC | PRN
  Administered 2022-09-10: 10 mL via INTRA_ARTERIAL

## 2022-09-10 MED ORDER — SODIUM CHLORIDE 0.9 % WEIGHT BASED INFUSION
3.0000 mL/kg/h | INTRAVENOUS | Status: AC
  Administered 2022-09-10: 3 mL/kg/h via INTRAVENOUS

## 2022-09-10 MED ORDER — LIDOCAINE HCL (PF) 1 % IJ SOLN
INTRAMUSCULAR | Status: DC | PRN
  Administered 2022-09-10: 5 mL

## 2022-09-10 MED ORDER — HYDRALAZINE HCL 20 MG/ML IJ SOLN: 10.0000 mg | INTRAMUSCULAR | Status: DC | PRN

## 2022-09-10 MED ORDER — LIDOCAINE HCL (PF) 1 % IJ SOLN
INTRAMUSCULAR | Status: AC
  Filled 2022-09-10: qty 30

## 2022-09-10 MED ORDER — MIDAZOLAM HCL 2 MG/2ML IJ SOLN
INTRAMUSCULAR | Status: AC
  Filled 2022-09-10: qty 2

## 2022-09-10 MED ORDER — SODIUM CHLORIDE 0.9 % WEIGHT BASED INFUSION: 1.0000 mL/kg/h | INTRAVENOUS | Status: DC

## 2022-09-10 MED ORDER — SODIUM CHLORIDE 0.9 % IV SOLN: 250.0000 mL | INTRAVENOUS | Status: DC | PRN

## 2022-09-10 MED ORDER — IOHEXOL 350 MG/ML SOLN
INTRAVENOUS | Status: DC | PRN
  Administered 2022-09-10: 40 mL

## 2022-09-10 MED ORDER — MIDAZOLAM HCL 2 MG/2ML IJ SOLN
INTRAMUSCULAR | Status: DC | PRN
  Administered 2022-09-10 (×2): 1 mg via INTRAVENOUS

## 2022-09-10 MED ORDER — VERAPAMIL HCL 2.5 MG/ML IV SOLN
INTRAVENOUS | Status: AC
  Filled 2022-09-10: qty 2

## 2022-09-10 MED ORDER — FENTANYL CITRATE (PF) 100 MCG/2ML IJ SOLN
INTRAMUSCULAR | Status: DC | PRN
  Administered 2022-09-10 (×2): 25 ug via INTRAVENOUS

## 2022-09-10 MED ORDER — ONDANSETRON HCL 4 MG/2ML IJ SOLN: 4.0000 mg | Freq: Four times a day (QID) | INTRAMUSCULAR | Status: DC | PRN

## 2022-09-10 MED ORDER — SODIUM CHLORIDE 0.9% FLUSH: 3.0000 mL | INTRAVENOUS | Status: DC | PRN

## 2022-09-10 SURGICAL SUPPLY — 9 items
CATH 5FR JL3.5 JR4 ANG PIG MP (CATHETERS) IMPLANT
DEVICE RAD TR BAND REGULAR (VASCULAR PRODUCTS) IMPLANT
GLIDESHEATH SLEND SS 6F .021 (SHEATH) IMPLANT
GUIDEWIRE INQWIRE 1.5J.035X260 (WIRE) IMPLANT
INQWIRE 1.5J .035X260CM (WIRE) ×1
PACK CARDIAC CATHETERIZATION (CUSTOM PROCEDURE TRAY) ×2 IMPLANT
SET ATX-X65L (MISCELLANEOUS) IMPLANT
SHEATH PROBE COVER 6X72 (BAG) IMPLANT
WIRE MICROINTRODUCER 60CM (WIRE) IMPLANT

## 2022-09-10 NOTE — Discharge Instructions (Signed)
Drink plenty of fluids for 48 hours and keep wrist elevated at heart level for 24 hours  Radial Site Care   This sheet gives you information about how to care for yourself after your procedure. Your health care provider may also give you more specific instructions. If you have problems or questions, contact your health care provider. What can I expect after the procedure? After the procedure, it is common to have: Bruising and tenderness at the catheter insertion area. Follow these instructions at home: Medicines Take over-the-counter and prescription medicines only as told by your health care provider. Insertion site care Follow instructions from your health care provider about how to take care of your insertion site. Make sure you: Wash your hands with soap and water before you change your bandage (dressing). If soap and water are not available, use hand sanitizer. Remove your dressing TOMORROW 8/30/20224 AT 12 NOON Check your insertion site every day for signs of infection. Check for: Redness, swelling, or pain. Fluid or blood. Pus or a bad smell. Warmth. Do not take baths, swim, or use a hot tub FOR 7 DAYS You may shower AFTER YOU REMOVE THE DRESSING Pat the area dry with a clean towel. Do not rub the site. That could cause bleeding. Do not apply powder or lotion to the site. Activity   For 24 hours after the procedure, or as directed by your health care provider: Do not flex or bend the affected arm. Do not push or pull heavy objects with the affected arm. Do not drive yourself home from the hospital or clinic. You may drive 24 hours after the procedure unless your health care provider tells you not to. Do not operate machinery or power tools. Do not lift anything that is heavier than 10 lb (4.5 kg), or the limit that you are told, until your health care provider says that it is safe.  For 4 days Ask your health care provider when it is okay to: Return to work or  school. Resume usual physical activities or sports. Resume sexual activity. General instructions If the catheter site starts to bleed, raise your arm and put firm pressure on the site for 15 minutes. If the bleeding does not stop, reapply pressure and get help right away. This is a medical emergency. If you went home on the same day as your procedure, a responsible adult should be with you for the first 24 hours after you arrive home. Keep all follow-up visits as told by your health care provider. This is important. Contact a health care provider if: You have a fever. You have redness, swelling, or yellow drainage around your insertion site. Get help right away if: You have unusual pain at the radial site. The catheter insertion area swells very fast. Raise your arm, apply firm pressure for 15 minutes. The insertion area is bleeding, and the bleeding does not stop when you hold steady pressure on the area for 15 minutes. Your arm or hand becomes pale, cool, tingly, or numb. These symptoms may represent a serious problem that is an emergency. Do not wait to see if the symptoms will go away. Get medical help right away. Call your local emergency services (911 in the [place] Do not drive yourself to the hospital. Summary After the procedure, it is common to have bruising and tenderness at the site. Follow instructions from your health care provider about how to take care of your radial site wound. Check the wound every day for signs of  infection. Do not lift anything that is heavier than 10 lb (4.5 kg), or the limit that you are told, until your health care provider says that it is safe. This information is not intended to replace advice given to you by your health care provider. Make sure you discuss any questions you have with your health care provider. Document Revised: 02/03/2017 Document Reviewed: 02/03/2017 Elsevier Patient Education  2020 [employer].

## 2022-09-10 NOTE — Interval H&P Note (Signed)
History and Physical Interval Note:  09/10/2022 9:06 AM  Rebecca Martinez  has presented today for surgery, with the diagnosis of abnormal stress test.  The various methods of treatment have been discussed with the patient and family. After consideration of risks, benefits and other options for treatment, the patient has consented to  Procedure(s): LEFT HEART CATH AND CORONARY ANGIOGRAPHY (N/A) as a surgical intervention.  The patient's history has been reviewed, patient examined, no change in status, stable for surgery.  I have reviewed the patient's chart and labs.  Questions were answered to the patient's satisfaction.   Cath Lab Visit (complete for each Cath Lab visit)  Clinical Evaluation Leading to the Procedure:   ACS: No.  Non-ACS:    Anginal Classification: No Symptoms  Anti-ischemic medical therapy: Minimal Therapy (1 class of medications)  Non-Invasive Test Results: Intermediate-risk stress test findings: cardiac mortality 1-3%/year  Prior CABG: No previous CABG        [facility]on [facility] 09/10/2022 9:06 AM

## 2022-09-11 ENCOUNTER — Encounter (HOSPITAL_COMMUNITY): Payer: Self-pay | Admitting: Cardiology

## 2022-09-25 ENCOUNTER — Other Ambulatory Visit (HOSPITAL_COMMUNITY): Payer: BC Managed Care – PPO

## 2022-10-02 DIAGNOSIS — Z23 Encounter for immunization: Secondary | ICD-10-CM | POA: Diagnosis not present

## 2022-10-05 ENCOUNTER — Ambulatory Visit (HOSPITAL_COMMUNITY): Payer: BC Managed Care – PPO | Attending: Cardiology

## 2022-10-05 DIAGNOSIS — R9431 Abnormal electrocardiogram [ECG] [EKG]: Secondary | ICD-10-CM

## 2022-10-05 DIAGNOSIS — I493 Ventricular premature depolarization: Secondary | ICD-10-CM | POA: Diagnosis not present

## 2022-10-05 LAB — ECHOCARDIOGRAM COMPLETE
Area-P 1/2: 3.37 cm2
S' Lateral: 3 cm

## 2022-10-06 ENCOUNTER — Encounter: Payer: Self-pay | Admitting: Internal Medicine

## 2022-10-06 ENCOUNTER — Other Ambulatory Visit: Payer: Self-pay | Admitting: Internal Medicine

## 2022-10-06 DIAGNOSIS — I493 Ventricular premature depolarization: Secondary | ICD-10-CM

## 2022-10-06 MED ORDER — METOPROLOL SUCCINATE ER 50 MG PO TB24
50.0000 mg | ORAL_TABLET | Freq: Every day | ORAL | Status: DC
Start: 2022-10-06 — End: 2023-02-16

## 2022-10-06 NOTE — Telephone Encounter (Signed)
Pt stated her ECHO results show PVCs still present even after being on Metoprolol 25mg , once daily since end of August. Not symptomatic but does feel it takes more effort to exercise since starting metoprolol. Informed her I will send to Dr. Wyline Mood and f/u with her.

## 2022-10-06 NOTE — Telephone Encounter (Signed)
Left message for patient to return the call.

## 2022-10-06 NOTE — Telephone Encounter (Signed)
Called pt back, Pt informed of providers result & recommendations. Pt verbalized understanding. She states that she will increase her medication as directed. She states that she "is not happy" with these directions but, she will try and "see how this goes" she will call back with any issues. Updated medication list

## 2022-10-12 DIAGNOSIS — H524 Presbyopia: Secondary | ICD-10-CM | POA: Diagnosis not present

## 2022-10-12 DIAGNOSIS — H35363 Drusen (degenerative) of macula, bilateral: Secondary | ICD-10-CM | POA: Diagnosis not present

## 2022-11-11 ENCOUNTER — Ambulatory Visit: Payer: BC Managed Care – PPO | Attending: Internal Medicine | Admitting: Internal Medicine

## 2022-11-11 ENCOUNTER — Encounter: Payer: Self-pay | Admitting: Internal Medicine

## 2022-11-11 VITALS — BP 102/72 | HR 54 | Ht 66.0 in | Wt 156.0 lb

## 2022-11-11 DIAGNOSIS — R232 Flushing: Secondary | ICD-10-CM | POA: Diagnosis not present

## 2022-11-11 DIAGNOSIS — I493 Ventricular premature depolarization: Secondary | ICD-10-CM | POA: Diagnosis not present

## 2022-11-11 DIAGNOSIS — Z780 Asymptomatic menopausal state: Secondary | ICD-10-CM | POA: Diagnosis not present

## 2022-11-11 NOTE — Patient Instructions (Signed)
Medication Instructions:  No medication changes *If you need a refill on your cardiac medications before your next appointment, please call your pharmacy*   Lab Work: None If you have labs (blood work) drawn today and your tests are completely normal, you will receive your results only by: MyChart Message (if you have MyChart) OR A paper copy in the mail If you have any lab test that is abnormal or we need to change your treatment, we will call you to review the results.   Testing/Procedures: None ordered   Follow-Up: At [facility], you and your health needs are our priority.  As part of our continuing mission to provide you with exceptional heart care, we have created designated Provider Care Teams.  These Care Teams include your primary Cardiologist (physician) and Advanced Practice Providers (APPs -  Physician Assistants and Nurse Practitioners) who all work together to provide you with the care you need, when you need it.  We recommend signing up for the patient portal called "MyChart".  Sign up information is provided on this After Visit Summary.  MyChart is used to connect with patients for Virtual Visits (Telemedicine).  Patients are able to view lab/test results, encounter notes, upcoming appointments, etc.  Non-urgent messages can be sent to your provider as well.   To learn more about what you can do with MyChart, go to [email or web address].    Your next appointment:   3 month(s)  Provider:   Maisie Fus, MD

## 2022-11-11 NOTE — Progress Notes (Signed)
Cardiology Office Note:    Date:  11/11/2022   ID:  Rebecca Martinez, DOB May 28, 1970, MRN [id number]  PCP:  Camie Patience, FNP   [facility] Providers Cardiologist:  Maisie Fus, MD     Referring MD: Camie Patience, FNP   No chief complaint on file. Presyncope  History of Present Illness:    Rebecca Martinez is a 52 y.o. female   Rebecca Martinez presents with a chief complaint of almost passing out during a recent [place] race. She reports that she has participated in close to a hundred similar races, including events with up to 32 miles and 70 obstacles, without experiencing this issue before. During the race, the patient felt their heart racing and experienced tingling in their fingers and lips. She required medical assistance, and their blood pressure and heart rate were found to be elevated at the time.She  denies any history of loss of consciousness.  She  has a family history of heart issues, including a maternal grandfather with a defibrillator, ?CABG, and congestive heart failure, and a paternal grandfather who died from a heart attack in his mid-40s. She has been on blood pressure medication since the age of 31. She reports that her blood pressure is usually normal when not engaging in physical activity, with recent readings of 125/72 and 120/82. 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 Qtc 447 ms  Interim hx 11/11/2022 The BB is hindering her exercise. She feels very sluggish.  She would like to stop therapy if feasible.  Otherwise she denies chest pain.  Past Medical History:  Diagnosis Date   Eczema    Hypertension    Migraine     Past Surgical History:  Procedure Laterality Date   LEFT HEART CATH AND CORONARY ANGIOGRAPHY N/A 09/10/2022   Procedure: LEFT HEART CATH AND CORONARY ANGIOGRAPHY;  Surgeon: [place], Peter M, MD;  Location: [facility] INVASIVE CV LAB;  Service: Cardiovascular;  Laterality: N/A;   NASAL SINUS SURGERY     TEMPOROMANDIBULAR JOINT  ARTHROPLASTY      Current Medications: Current Meds  Medication Sig   albuterol (VENTOLIN HFA) 108 (90 Base) MCG/ACT inhaler Inhale 2 puffs into the lungs every 4 (four) hours as needed for wheezing or shortness of breath (coughing).   Cholecalciferol (VITAMIN D) 50 MCG (2000 UT) tablet Take 2,000 Units by mouth 2 (two) times daily.   Coenzyme Q10 (CO Q-10) 300 MG CAPS Take 300 mg by mouth at bedtime.   diphenhydrAMINE (BENADRYL) 25 MG tablet Take 50 mg by mouth at bedtime as needed for itching.   EPINEPHrine (EPI-PEN) 0.3 mg/0.3 mL SOAJ injection Inject 0.3 mg into the muscle once.   fexofenadine (ALLEGRA) 180 MG tablet Take 180 mg by mouth daily as needed for allergies or rhinitis.   Glucosamine-Chondroit-Vit C-Mn (GLUCOSAMINE CHONDR 1500 COMPLX) CAPS Take 1 capsule by mouth daily.   hydrochlorothiazide (HYDRODIURIL) 25 MG tablet Take 25 mg by mouth daily.   hydrocortisone 2.5 % cream Apply 1 Application topically 2 (two) times daily as needed (itching).   ibuprofen (ADVIL,MOTRIN) 200 MG tablet Take 400 mg by mouth every 6 (six) hours as needed for pain or moderate pain.   levonorgestrel (MIRENA) 20 MCG/24HR IUD 1 each by Intrauterine route once.   MAGNESIUM PO Take 150 mg by mouth at bedtime.   metoprolol succinate (TOPROL-XL) 50 MG 24 hr tablet Take 1 tablet (50 mg total) by mouth daily.   Omega-3 Fatty Acids (OMEGA 3 500  PO) Take 500 mg by mouth daily.   ondansetron (ZOFRAN ODT) 4 MG disintegrating tablet Take 1 tablet (4 mg total) by mouth every 8 (eight) hours as needed for nausea or vomiting.   potassium chloride SA (KLOR-CON M) 20 MEQ tablet Take 20 mEq by mouth daily.   Probiotic Product (PROBIOTIC PO) Take 1 capsule by mouth daily.   Riboflavin (B-2-400) 400 MG CAPS Take 400 mg by mouth at bedtime.   SUMAtriptan Succinate (ZEMBRACE SYMTOUCH) 3 MG/0.5ML SOAJ Inject 3 mg into the skin as needed (May repeat after 1 hour.  Maximum 2 injections in 24 hours.).   Turmeric 500 MG CAPS  Take 500 mg by mouth daily.     Allergies:   Shrimp (diagnostic), Codeine, Penicillins, Tape, Cephalexin, Doxycycline hyclate, and Milk-related compounds   Social History   Socioeconomic History   Marital status: Married    Spouse name: Not on file   Number of children: Not on file   Years of education: Not on file   Highest education level: Not on file  Occupational History   Not on file  Tobacco Use   Smoking status: Never   Smokeless tobacco: Never  Vaping Use   Vaping status: Never Used  Substance and Sexual Activity   Alcohol use: Yes    Comment: occ   Drug use: No   Sexual activity: Not on file  Other Topics Concern   Not on file  Social History Narrative   Right handed   Lives with husband in one story home   Drinks caffeine   Social Determinants of Health   Financial Resource Strain: Not on file  Food Insecurity: Not on file  Transportation Needs: Not on file  Physical Activity: Not on file  Stress: Not on file  Social Connections: Unknown (05/24/2021)   Received [facility]   Social Network    Social Network: Not on file     Family History: The patient's family history includes Allergic rhinitis in her daughter, daughter, mother, and son. There is no history of Immunodeficiency, Urticaria, Eczema, Angioedema, Asthma, or Atopy.  ROS:   Please see the history of present illness.     All other systems reviewed and are negative.  EKGs/Labs/Other Studies Reviewed:     Recent Labs: 09/09/2022: BUN 18; Creatinine, Ser 0.90; Hemoglobin 13.7; Platelets 264; Potassium 3.7; Sodium 141  Recent Lipid Panel No results found for: "CHOL", "TRIG", "HDL", "CHOLHDL", "VLDL", "LDLCALC", "LDLDIRECT"   Risk Assessment/Calculations:     Physical Exam:    VS:  BP 102/72   Pulse (!) 54   Ht 5\' 6"  (1.676 m)   Wt 156 lb (70.8 kg)   SpO2 98%   BMI 25.18 kg/m    Vitals:   11/11/22 0828  BP: 102/72  Pulse: (!) 54  SpO2: 98%    Wt Readings  from Last 3 Encounters:  11/11/22 156 lb (70.8 kg)  09/10/22 152 lb (68.9 kg)  08/20/22 157 lb (71.2 kg)     GEN:  Well nourished, well developed in no acute distress HEENT: Normal NECK: No JVD CARDIAC: RRR, no murmurs, rubs, gallops RESPIRATORY:  Clear to auscultation without rales, wheezing or rhonchi  ABDOMEN: Soft, non-tender, non-distended MUSCULOSKELETAL:  No edema; No deformity  SKIN: Warm and dry NEUROLOGIC:  Alert and oriented x 3 PSYCHIATRIC:  Normal affect   ASSESSMENT:   Exercise intolerance/ Presyncope -POET showed infero-lateral ST depression  - to rule out coronary anomaly; underwent LHC which was  very normal -Echocardiogram in September 2024 was also normal. -Her Zio patch did show 14% PVCs which is a high burden. EKG localizes PVCs to RVOT. I recommended metoprolol Xl 50 mg daily.  She has a very active lifestyle and her beta-blocker therapy is limiting her ability to exercise.  We discussed the potential for a PVC ablation.  Will refer her to EP to determine if this is indicated. -Otherwise her blood pressure is very well-controlled PLAN:    In order of problems listed above:  Referral to EP to consider PVC ablation Follow up in 3 months  Medication Adjustments/Labs and Tests Ordered: Current medicines are reviewed at length with the patient today.  Concerns regarding medicines are outlined above.  No orders of the defined types were placed in this encounter.  No orders of the defined types were placed in this encounter.   There are no Patient Instructions on file for this visit.   Signed, Maisie Fus, MD  11/11/2022 8:42 AM    [facility]

## 2022-11-17 ENCOUNTER — Encounter: Payer: Self-pay | Admitting: Cardiology

## 2022-11-17 ENCOUNTER — Ambulatory Visit: Payer: BC Managed Care – PPO | Attending: Cardiology | Admitting: Cardiology

## 2022-11-17 VITALS — BP 118/72 | HR 73 | Ht 66.0 in | Wt 158.8 lb

## 2022-11-17 DIAGNOSIS — I493 Ventricular premature depolarization: Secondary | ICD-10-CM | POA: Diagnosis not present

## 2022-11-17 DIAGNOSIS — R42 Dizziness and giddiness: Secondary | ICD-10-CM | POA: Diagnosis not present

## 2022-11-17 DIAGNOSIS — R9431 Abnormal electrocardiogram [ECG] [EKG]: Secondary | ICD-10-CM

## 2022-11-17 DIAGNOSIS — R55 Syncope and collapse: Secondary | ICD-10-CM

## 2022-11-17 DIAGNOSIS — R002 Palpitations: Secondary | ICD-10-CM

## 2022-11-17 MED ORDER — FLECAINIDE ACETATE 50 MG PO TABS: 50.0000 mg | ORAL_TABLET | Freq: Two times a day (BID) | ORAL | 3 refills | Status: DC

## 2022-11-17 NOTE — Progress Notes (Signed)
  Electrophysiology Office Note:   Date:  11/17/2022  ID:  Rebecca Martinez, DOB [birth year], MRN [id number]  Primary Cardiologist: Maisie Fus, MD Electrophysiologist: None      History of Present Illness:   Rebecca Martinez is a 52 y.o. female with h/o PVCs seen today for  for Electrophysiology evaluation of PVCs at the request of Carolan Clines.    She has a history of near syncope.  She was participating in a [place] race.  She has participated in multiple of these races without experiencing similar symptoms.  She felt her heart racing and tingling in her fingers and lips.  Her blood pressure and heart rate were found to be elevated at the time.  She did not lose consciousness.  She has not had any further episodes of syncope.  She was started on metoprolol for her PVCs, but noted significant exercise intolerance.  Metoprolol has been stopped.  She did have an exercise treadmill test that showed PVCs during peak exercise as well as ST depressions.  She had a left heart catheterization without coronary artery disease.  Review of systems complete and found to be negative unless listed in HPI.   EP Information / Studies Reviewed:    EKG is ordered today. Personal review as below.  EKG Interpretation Date/Time:  Tuesday November 17 2022 11:57:51 EST Ventricular Rate:  73 PR Interval:  170 QRS Duration:  82 QT Interval:  414 QTC Calculation: 456 R Axis:   77  Text Interpretation: Sinus rhythm with Premature atrial complexes with Premature ventricular complexes When compared with ECG of 20-Aug-2022 09:39, No significant change since last tracing Confirmed by Jayquan Bradsher ([id number]) on 11/17/2022 12:00:03 PM     Risk Assessment/Calculations:              Physical Exam:   VS:  BP 118/72 (BP Location: Left Arm, Patient Position: Sitting, Cuff Size: Normal)   Pulse 73   Ht 5\' 6"  (1.676 m)   Wt 158 lb 12.8 oz (72 kg)   SpO2 97%   BMI 25.63 kg/m    Wt Readings from Last 3  Encounters:  11/17/22 158 lb 12.8 oz (72 kg)  11/11/22 156 lb (70.8 kg)  09/10/22 152 lb (68.9 kg)     GEN: Well nourished, well developed in no acute distress NECK: No JVD; No carotid bruits CARDIAC: Regular rate and rhythm with occasional ectopy, no murmurs, rubs, gallops RESPIRATORY:  Clear to auscultation without rales, wheezing or rhonchi  ABDOMEN: Soft, non-tender, non-distended EXTREMITIES:  No edema; No deformity   ASSESSMENT AND PLAN:    1.  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  She has some exercise intolerance, and did have PVCs during exercise on her exercise treadmill test.  She would benefit from suppression of PVCs.  Due to that, we Nobuo Nunziata plan for ablation.  Risk and benefits have been discussed and include bleeding, tamponade, heart block, stroke, MI, renal failure, death.  She understands these risks and is agreed to the procedure.  The in the interim, we Feven Alderfer start her on flecainide 50 mg twice daily.  Rydge Texidor get an EKG in 2 weeks.  Follow up with Dr. Elberta Fortis as usual post procedure  Signed, Caswell Alvillar Jorja Loa, MD

## 2022-11-17 NOTE — Patient Instructions (Addendum)
Medication Instructions:  START Flecainide 50 mg twice daily - EKG nurse visit  *If you need a refill on your cardiac medications before your next appointment, please call your pharmacy*   Testing/Procedures: PVC Ablation - scheduled for 03/16/23 Your physician has recommended that you have an ablation. Catheter ablation is a medical procedure used to treat some cardiac arrhythmias (irregular heartbeats). During catheter ablation, a long, thin, flexible tube is put into a blood vessel in your groin (upper thigh), or neck. This tube is called an ablation catheter. It is then guided to your heart through the blood vessel. Radio frequency waves destroy small areas of heart tissue where abnormal heartbeats may cause an arrhythmia to start. Please see the instruction sheet given to you today.   Follow-Up: At [facility], you and your health needs are our priority.  As part of our continuing mission to provide you with exceptional heart care, we have created designated Provider Care Teams.  These Care Teams include your primary Cardiologist (physician) and Advanced Practice Providers (APPs -  Physician Assistants and Nurse Practitioners) who all work together to provide you with the care you need, when you need it.  We recommend signing up for the patient portal called "MyChart".  Sign up information is provided on this After Visit Summary.  MyChart is used to connect with patients for Virtual Visits (Telemedicine).  Patients are able to view lab/test results, encounter notes, upcoming appointments, etc.  Non-urgent messages can be sent to your provider as well.   To learn more about what you can do with MyChart, go to [email or web address].    Your next appointment:   We will schedule follow up after your ablation  Provider:   Loman Brooklyn, MD

## 2022-11-23 ENCOUNTER — Ambulatory Visit: Payer: BC Managed Care – PPO | Admitting: Internal Medicine

## 2022-12-03 ENCOUNTER — Ambulatory Visit: Payer: BC Managed Care – PPO | Attending: Cardiovascular Disease

## 2022-12-03 VITALS — BP 120/82 | Ht 66.0 in

## 2022-12-03 DIAGNOSIS — I493 Ventricular premature depolarization: Secondary | ICD-10-CM | POA: Diagnosis not present

## 2022-12-03 NOTE — Patient Instructions (Signed)
Medication Instructions:  Your physician recommends that you continue on your current medications as directed. Please refer to the Current Medication list given to you today.  *If you need a refill on your cardiac medications before your next appointment, please call your pharmacy*  Lab Work: None ordered today. If you have labs (blood work) drawn today and your tests are completely normal, you will receive your results only by: MyChart Message (if you have MyChart) OR A paper copy in the mail If you have any lab test that is abnormal or we need to change your treatment, we will call you to review the results.  Testing/Procedures: None ordered today.  Follow-Up: At [facility], you and your health needs are our priority.  As part of our continuing mission to provide you with exceptional heart care, we have created designated Provider Care Teams.  These Care Teams include your primary Cardiologist (physician) and Advanced Practice Providers (APPs -  Physician Assistants and Nurse Practitioners) who all work together to provide you with the care you need, when you need it.   Your next appointment:   Follow previous scheduled appointments   The format for your next appointment:   In Person

## 2022-12-03 NOTE — Progress Notes (Signed)
   Nurse Visit   Date of Encounter: 12/03/2022 ID: Rebecca Martinez, DOB 02-01-1970, MRN [id number]  PCP:  Camie Patience, FNP   [facility] Providers Cardiologist:  Maisie Fus, MD      Visit Details   VS:  BP 120/82 (BP Location: Right Arm, Patient Position: Sitting, Cuff Size: Normal)   Ht 5\' 6"  (1.676 m)   SpO2 98%   BMI 25.63 kg/m  , BMI Body mass index is 25.63 kg/m.  Wt Readings from Last 3 Encounters:  11/17/22 158 lb 12.8 oz (72 kg)  11/11/22 156 lb (70.8 kg)  09/10/22 152 lb (68.9 kg)     Reason for visit: EKG- Flecainide start Performed today: Vitals, EKG, Provider consulted:Dr. Mealor, and Education Changes (medications, testing, etc.) : None Length of Visit: 10 minutes    Medications Adjustments/Labs and Tests Ordered: Orders Placed This Encounter  Procedures   EKG 12-Lead   No orders of the defined types were placed in this encounter.    Gevena Mart, RN  12/03/2022 3:30 PM

## 2022-12-13 ENCOUNTER — Other Ambulatory Visit: Payer: Self-pay

## 2022-12-13 ENCOUNTER — Encounter (HOSPITAL_BASED_OUTPATIENT_CLINIC_OR_DEPARTMENT_OTHER): Payer: Self-pay | Admitting: Emergency Medicine

## 2022-12-13 ENCOUNTER — Emergency Department (HOSPITAL_BASED_OUTPATIENT_CLINIC_OR_DEPARTMENT_OTHER)
Admission: EM | Admit: 2022-12-13 | Discharge: 2022-12-13 | Disposition: A | Discharge status: Home / Self Care | Payer: BC Managed Care – PPO | Attending: Emergency Medicine | Admitting: Emergency Medicine

## 2022-12-13 DIAGNOSIS — G43811 Other migraine, intractable, with status migrainosus: Secondary | ICD-10-CM

## 2022-12-13 DIAGNOSIS — G43909 Migraine, unspecified, not intractable, without status migrainosus: Secondary | ICD-10-CM | POA: Diagnosis not present

## 2022-12-13 DIAGNOSIS — L239 Allergic contact dermatitis, unspecified cause: Secondary | ICD-10-CM | POA: Diagnosis not present

## 2022-12-13 DIAGNOSIS — G43809 Other migraine, not intractable, without status migrainosus: Secondary | ICD-10-CM | POA: Diagnosis not present

## 2022-12-13 DIAGNOSIS — L237 Allergic contact dermatitis due to plants, except food: Secondary | ICD-10-CM | POA: Diagnosis not present

## 2022-12-13 HISTORY — DX: Ventricular premature depolarization: I49.3

## 2022-12-13 MED ORDER — METOCLOPRAMIDE HCL 5 MG/ML IJ SOLN
10.0000 mg | Freq: Once | INTRAMUSCULAR | Status: AC
  Administered 2022-12-13: 10 mg via INTRAVENOUS
  Filled 2022-12-13: qty 2

## 2022-12-13 MED ORDER — LACTATED RINGERS IV BOLUS
1000.0000 mL | Freq: Once | INTRAVENOUS | Status: AC
  Administered 2022-12-13: 1000 mL via INTRAVENOUS

## 2022-12-13 MED ORDER — DEXAMETHASONE SODIUM PHOSPHATE 10 MG/ML IJ SOLN
10.0000 mg | Freq: Once | INTRAMUSCULAR | Status: AC
  Administered 2022-12-13: 10 mg via INTRAVENOUS
  Filled 2022-12-13: qty 1

## 2022-12-13 MED ORDER — DIPHENHYDRAMINE HCL 50 MG/ML IJ SOLN
25.0000 mg | Freq: Once | INTRAMUSCULAR | Status: AC
  Administered 2022-12-13: 25 mg via INTRAVENOUS
  Filled 2022-12-13: qty 1

## 2022-12-13 NOTE — ED Triage Notes (Signed)
Hives to arm/legs /abdomen.was due to see MD monday

## 2022-12-13 NOTE — ED Triage Notes (Signed)
Pt is here with migraine, hasn't had one in years. Sees a neurologist and takes a vitamin regimen at night, she has a rescue injection and afraid to take it because of dx of PVC"S>

## 2022-12-13 NOTE — ED Provider Notes (Signed)
[name]MENT AT [facility] Provider Note   CSN: [id number] Arrival date & time: 12/13/22  0865     History  Chief Complaint  Patient presents with   Migraine    Rebecca Martinez is a 52 y.o. female.  Patient is a 52 year old female with a history of migraine headaches currently on a vitamin regime and hypertension who is presenting today with complaints of a migraine starting in the middle of the night with persistence causing her to have nausea and vomiting.  She is photophobic but denies any visual changes or unilateral numbness weakness in her extremities.  She reports that it has been several years since she has had a migraine because the vitamin seem to be working well but is not sure if the red wine she had last night caused the migraine but feels similar to her prior migraines.  She has an injection at home but at times has clots PVCs and she was nervous to take it.  She also reports in the last few days she has had an itchy rash that started on her arms but then moved to her legs and abdomen.  She is not sure if she came in contact with something but does report she has very sensitive skin.  She has been taking Benadryl for that but denies any face or airway involvement.  The history is provided by the patient.  Migraine       Home Medications Prior to Admission medications   Medication Sig Start Date End Date Taking? Authorizing Provider  albuterol (VENTOLIN HFA) 108 (90 Base) MCG/ACT inhaler Inhale 2 puffs into the lungs every 4 (four) hours as needed for wheezing or shortness of breath (coughing). 06/16/19   Ellamae Sia, DO  Cholecalciferol (VITAMIN D) 50 MCG (2000 UT) tablet Take 2,000 Units by mouth 2 (two) times daily.    [provider]  Coenzyme Q10 (CO Q-10) 300 MG CAPS Take 300 mg by mouth at bedtime.    [provider]  diphenhydrAMINE (BENADRYL) 25 MG tablet Take 50 mg by mouth at bedtime as needed for itching.     [provider]  EPINEPHrine (EPI-PEN) 0.3 mg/0.3 mL SOAJ injection Inject 0.3 mg into the muscle once.    [provider]  fexofenadine (ALLEGRA) 180 MG tablet Take 180 mg by mouth daily as needed for allergies or rhinitis.    [provider]  flecainide (TAMBOCOR) 50 MG tablet Take 1 tablet (50 mg total) by mouth 2 (two) times daily. 11/17/22   Camnitz, Andree Coss, MD  Glucosamine-Chondroit-Vit C-Mn (GLUCOSAMINE CHONDR 1500 COMPLX) CAPS Take 1 capsule by mouth daily.    [provider]  hydrochlorothiazide (HYDRODIURIL) 25 MG tablet Take 25 mg by mouth daily.    [provider]  hydrocortisone 2.5 % cream Apply 1 Application topically 2 (two) times daily as needed (itching).    [provider]  ibuprofen (ADVIL,MOTRIN) 200 MG tablet Take 400 mg by mouth every 6 (six) hours as needed for pain or moderate pain.    [provider]  levonorgestrel (MIRENA) 20 MCG/24HR IUD 1 each by Intrauterine route once.    [provider]  MAGNESIUM PO Take 150 mg by mouth at bedtime.    [provider]  metoprolol succinate (TOPROL-XL) 50 MG 24 hr tablet Take 1 tablet (50 mg total) by mouth daily. 10/06/22 01/04/23  Maisie Fus, MD  Omega-3 Fatty Acids (OMEGA 3 500 PO) Take 500 mg by  mouth daily.    [provider]  ondansetron (ZOFRAN ODT) 4 MG disintegrating tablet Take 1 tablet (4 mg total) by mouth every 8 (eight) hours as needed for nausea or vomiting. 09/21/19   Everlena Cooper, Adam R, DO  potassium chloride SA (KLOR-CON M) 20 MEQ tablet Take 20 mEq by mouth daily.    [provider]  Probiotic Product (PROBIOTIC PO) Take 1 capsule by mouth daily.    [provider]  Riboflavin (B-2-400) 400 MG CAPS Take 400 mg by mouth at bedtime.    [provider]  SUMAtriptan Succinate (ZEMBRACE SYMTOUCH) 3 MG/0.5ML SOAJ Inject 3 mg into the skin as needed (May repeat after 1 hour.  Maximum 2 injections in 24  hours.). 04/22/21   Drema Dallas, DO  Turmeric 500 MG CAPS Take 500 mg by mouth daily.    [provider]  vortioxetine HBr (TRINTELLIX) 10 MG TABS tablet Take 10 mg by mouth daily. Patient not taking: Reported on 11/17/2022    [provider]      Allergies    Shrimp (diagnostic), Codeine, Penicillins, Tape, Cephalexin, Doxycycline hyclate, and Milk-related compounds    Review of Systems   Review of Systems  Physical Exam Updated Vital Signs BP (!) 150/100   Pulse 83   Temp 97.6 F (36.4 C) (Oral)   Resp (!) 22   SpO2 100%  Physical Exam Vitals and nursing note reviewed.  Constitutional:      General: She is not in acute distress.    Appearance: She is well-developed.  HENT:     Head: Normocephalic and atraumatic.     Mouth/Throat:     Mouth: Mucous membranes are moist.  Eyes:     Extraocular Movements: Extraocular movements intact.     Conjunctiva/sclera: Conjunctivae normal.     Pupils: Pupils are equal, round, and reactive to light.  Cardiovascular:     Rate and Rhythm: Normal rate and regular rhythm.     Heart sounds: Normal heart sounds. No murmur heard.    No friction rub.  Pulmonary:     Effort: Pulmonary effort is normal.     Breath sounds: Normal breath sounds. No wheezing or rales.  Abdominal:     General: Bowel sounds are normal. There is no distension.     Palpations: Abdomen is soft.     Tenderness: There is no abdominal tenderness. There is no guarding or rebound.  Musculoskeletal:        General: No tenderness. Normal range of motion.     Comments: No edema  Skin:    General: Skin is warm and dry.     Findings: Rash present.     Comments: Patchy erythematous excoriated rash over the flexor surface of the left arm and then in the medial thighs, groin and lower abdomen.  Blanches.  Neurological:     Mental Status: She is alert and oriented to person, place, and time.     Cranial Nerves: No cranial nerve deficit.  Psychiatric:         Mood and Affect: Mood normal.        Behavior: Behavior normal.     ED Results / Procedures / Treatments   Labs (all labs ordered are listed, but only abnormal results are displayed) Labs Reviewed - No data to display  EKG None  Radiology No results found.  Procedures Procedures    Medications Ordered in ED Medications  lactated ringers bolus 1,000 mL (1,000 mLs Intravenous New  Bag/Given 12/13/22 0836)  dexamethasone (DECADRON) injection 10 mg (10 mg Intravenous Given 12/13/22 0823)  metoCLOPramide (REGLAN) injection 10 mg (10 mg Intravenous Given 12/13/22 0822)  diphenhydrAMINE (BENADRYL) injection 25 mg (25 mg Intravenous Given 12/13/22 9604)    ED Course/ Medical Decision Making/ A&P                                 Medical Decision Making Risk Prescription drug management.   Pt with typical migraine HA without sx suggestive of SAH(sudden onset, worst of life, or deficits), infection, or cavernous vein thrombosis.  Normal neuro exam and vital signs. Will give HA cocktail and re-eval.  Patient also has an allergic appearing rash gathered over her body appears to be more contact.  Decadron and the headache cocktail should also help with this and she has been taking Benadryl.  10:13 AM Patient reporting feeling very anxious and may be dystonic reaction from Reglan.  She was given a dose of Benadryl.  10:13 AM Patient feeling better and headache has improved.  She would like to go home.         Final Clinical Impression(s) / ED Diagnoses Final diagnoses:  Other migraine with status migrainosus, intractable  Allergic contact dermatitis, unspecified trigger    Rx / DC Orders ED Discharge Orders     None         Gwyneth Sprout, MD 12/13/22 1013

## 2022-12-13 NOTE — ED Notes (Signed)
Pt called and feeling extremely anxious, md informed,benadryl administered. Pt was jittery,shaking,emotional. Sat with pt, offered emotional support, hand massage, sat on side of bed. Starting feeling better. Now resting.

## 2022-12-13 NOTE — ED Notes (Signed)
Pt called out stating "I feel very anxious". Pt requesting medication to make her feel less anxious.

## 2022-12-13 NOTE — ED Notes (Signed)
Dc instructions reviewed with patient. Patient voiced understanding. Dc with belongings.  °

## 2023-02-09 DIAGNOSIS — I10 Essential (primary) hypertension: Secondary | ICD-10-CM | POA: Diagnosis not present

## 2023-02-09 DIAGNOSIS — R110 Nausea: Secondary | ICD-10-CM | POA: Diagnosis not present

## 2023-02-16 ENCOUNTER — Ambulatory Visit: Payer: BC Managed Care – PPO | Attending: Internal Medicine | Admitting: Internal Medicine

## 2023-02-16 ENCOUNTER — Encounter: Payer: Self-pay | Admitting: Internal Medicine

## 2023-02-16 VITALS — BP 136/86 | HR 75 | Ht 66.0 in | Wt 154.6 lb

## 2023-02-16 DIAGNOSIS — I493 Ventricular premature depolarization: Secondary | ICD-10-CM | POA: Diagnosis not present

## 2023-02-16 NOTE — Progress Notes (Signed)
 Cardiology Office Note:    Date:  02/16/2023   ID:  Rebecca Martinez, DOB Jul 02, 1970, MRN [id number]  PCP:  Rebecca Anthony RAMAN, FNP   [facility] Providers Cardiologist:  Alvan Ronal BRAVO, MD     Referring MD: Rebecca Anthony RAMAN, FNP   No chief complaint on file. Presyncope  History of Present Illness:    Rebecca Martinez is a 53 y.o. female   Rebecca Martinez presents with a chief complaint of almost passing out during a recent [place] race. She reports that she has participated in close to a hundred similar races, including events with up to 32 miles and 70 obstacles, without experiencing this issue before. During the race, the patient felt their heart racing and experienced tingling in their fingers and lips. She required medical assistance, and their blood pressure and heart rate were found to be elevated at the time.She  denies any history of loss of consciousness.  She  has a family history of heart issues, including a maternal grandfather with a defibrillator, ?CABG, and congestive heart failure, and a paternal grandfather who died from a heart attack in his mid-40s. She has been on blood pressure medication since the age of 25. She reports that her blood pressure is usually normal when not engaging in physical activity, with recent readings of 125/72 and 120/82. 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 Qtc 447 ms  Interim hx 11/11/2022 The BB is hindering her exercise. She feels very sluggish.  She would like to stop therapy if feasible.  Otherwise she denies chest pain.  Interim hx 02/16/2023 She is planned for ablation March 4th. She is on flecainide . She is off BB. Her exercising is still limited.  Past Medical History:  Diagnosis Date   Eczema    Hypertension    Migraine    PVC (premature ventricular contraction)     Past Surgical History:  Procedure Laterality Date   LEFT HEART CATH AND CORONARY ANGIOGRAPHY N/A 09/10/2022   Procedure: LEFT HEART CATH AND  CORONARY ANGIOGRAPHY;  Surgeon: Jordan, Peter M, MD;  Location: [facility] INVASIVE CV LAB;  Service: Cardiovascular;  Laterality: N/A;   NASAL SINUS SURGERY     TEMPOROMANDIBULAR JOINT ARTHROPLASTY      Current Medications: Current Meds  Medication Sig   albuterol  (VENTOLIN  HFA) 108 (90 Base) MCG/ACT inhaler Inhale 2 puffs into the lungs every 4 (four) hours as needed for wheezing or shortness of breath (coughing).   Cholecalciferol  (VITAMIN D ) 50 MCG (2000 UT) tablet Take 2,000 Units by mouth 2 (two) times daily.   Coenzyme Q10 (CO Q-10) 300 MG CAPS Take 300 mg by mouth at bedtime.   diphenhydrAMINE  (BENADRYL ) 25 MG tablet Take 50 mg by mouth at bedtime as needed for itching.   EPINEPHrine  (EPI-PEN) 0.3 mg/0.3 mL SOAJ injection Inject 0.3 mg into the muscle once.   fexofenadine (ALLEGRA) 180 MG tablet Take 180 mg by mouth daily as needed for allergies or rhinitis.   flecainide  (TAMBOCOR ) 50 MG tablet Take 1 tablet (50 mg total) by mouth 2 (two) times daily.   Glucosamine-Chondroit-Vit C-Mn (GLUCOSAMINE CHONDR 1500 COMPLX) CAPS Take 1 capsule by mouth daily.   hydrochlorothiazide  (HYDRODIURIL ) 25 MG tablet Take 25 mg by mouth daily.   hydrocortisone 2.5 % cream Apply 1 Application topically 2 (two) times daily as needed (itching).   ibuprofen (ADVIL,MOTRIN) 200 MG tablet Take 400 mg by mouth every 6 (six) hours as needed for pain or moderate pain.  levonorgestrel  (MIRENA ) 20 MCG/24HR IUD 1 each by Intrauterine route once.   MAGNESIUM PO Take 150 mg by mouth at bedtime.   Omega-3 Fatty Acids (OMEGA 3 500 PO) Take 500 mg by mouth daily.   ondansetron  (ZOFRAN  ODT) 4 MG disintegrating tablet Take 1 tablet (4 mg total) by mouth every 8 (eight) hours as needed for nausea or vomiting.   potassium chloride  SA (KLOR-CON  M) 20 MEQ tablet Take 20 mEq by mouth daily.   Probiotic Product (PROBIOTIC PO) Take 1 capsule by mouth daily.   Riboflavin  (B-2-400) 400 MG CAPS Take 400 mg by mouth at bedtime.    SUMAtriptan  Succinate (ZEMBRACE SYMTOUCH ) 3 MG/0.5ML SOAJ Inject 3 mg into the skin as needed (May repeat after 1 hour.  Maximum 2 injections in 24 hours.).   Turmeric 500 MG CAPS Take 500 mg by mouth daily.   vortioxetine HBr (TRINTELLIX) 10 MG TABS tablet Take 10 mg by mouth daily.     Allergies:   Shrimp (diagnostic), Codeine, Penicillins, Tape, Cephalexin, Doxycycline hyclate, and Milk-related compounds    Family History: The patient's family history includes Allergic rhinitis in her daughter, daughter, mother, and son. There is no history of Immunodeficiency, Urticaria, Eczema, Angioedema, Asthma, or Atopy.  ROS:   Please see the history of present illness.     All other systems reviewed and are negative.  EKGs/Labs/Other Studies Reviewed:     Recent Labs: 09/09/2022: BUN 18; Creatinine, Ser 0.90; Hemoglobin 13.7; Platelets 264; Potassium 3.7; Sodium 141  Recent Lipid Panel No results found for: CHOL, TRIG, HDL, CHOLHDL, VLDL, LDLCALC, LDLDIRECT   Risk Assessment/Calculations:     Physical Exam:    VS:  Pulse 75   Ht 5' 6 (1.676 m)   Wt 154 lb 9.6 oz (70.1 kg)   SpO2 97%   BMI 24.95 kg/m    Vitals:   02/16/23 0806  Pulse: 75  SpO2: 97%    Wt Readings from Last 3 Encounters:  02/16/23 154 lb 9.6 oz (70.1 kg)  11/17/22 158 lb 12.8 oz (72 kg)  11/11/22 156 lb (70.8 kg)     GEN:  Well nourished, well developed in no acute distress HEENT: Normal NECK: No JVD CARDIAC: RRR, no murmurs, rubs, gallops RESPIRATORY:  Clear to auscultation without rales, wheezing or rhonchi  ABDOMEN: Soft, non-tender, non-distended MUSCULOSKELETAL:  No edema; No deformity  SKIN: Warm and dry NEUROLOGIC:  Alert and oriented x 3 PSYCHIATRIC:  Normal affect   ASSESSMENT and PLAN  PVCs -POET showed infero-lateral ST depression ; frequent PVCs - to rule out coronary anomaly; underwent LHC which was very normal -Echocardiogram in September 2024 was also normal. -Her Zio  patch did show 14% PVCs which is a high burden. EKG localizes PVCs to RVOT. I recommended metoprolol  Xl 50 mg daily.  She has a very active lifestyle and her beta-blocker therapy is limiting her ability to exercise.  We discussed the potential for a PVC ablation. She met with Dr. Inocencio and is planned for ablation in March and was started on flecainide  50 mg BID. DISPO:    In order of problems listed above:  Follow up in 6 months with an APP  Medication Adjustments/Labs and Tests Ordered: Current medicines are reviewed at length with the patient today.  Concerns regarding medicines are outlined above.  No orders of the defined types were placed in this encounter.  No orders of the defined types were placed in this encounter.   There are no Patient Instructions  on file for this visit.   Signed, Alvan Ronal BRAVO, MD  02/16/2023 8:10 AM    [facility]

## 2023-02-16 NOTE — Patient Instructions (Signed)
Medication Instructions:  No changes  *If you need a refill on your cardiac medications before your next appointment, please call your pharmacy*   Lab Work: None  If you have labs (blood work) drawn today and your tests are completely normal, you will receive your results only by: MyChart Message (if you have MyChart) OR A paper copy in the mail If you have any lab test that is abnormal or we need to change your treatment, we will call you to review the results.   Testing/Procedures: None    Follow-Up: At [facility], you and your health needs are our priority.  As part of our continuing mission to provide you with exceptional heart care, we have created designated Provider Care Teams.  These Care Teams include your primary Cardiologist (physician) and Advanced Practice Providers (APPs -  Physician Assistants and Nurse Practitioners) who all work together to provide you with the care you need, when you need it.    Your next appointment:   6 month(s)  Provider:   With any APP   Other Instructions   1st Floor: - Lobby - Registration  - Pharmacy  - Lab - Cafe  2nd Floor: - PV Lab - Diagnostic Testing (echo, CT, nuclear med)  3rd Floor: - Vacant  4th Floor: - TCTS (cardiothoracic surgery) - AFib Clinic - Structural Heart Clinic - Vascular Surgery  - Vascular Ultrasound  5th Floor: - HeartCare Cardiology (general and EP) - Clinical Pharmacy for coumadin, hypertension, lipid, weight-loss medications, and med management appointments    Valet parking services will be available as well.

## 2023-02-18 ENCOUNTER — Other Ambulatory Visit: Payer: Self-pay

## 2023-02-18 DIAGNOSIS — Z01818 Encounter for other preprocedural examination: Secondary | ICD-10-CM

## 2023-02-18 DIAGNOSIS — R002 Palpitations: Secondary | ICD-10-CM

## 2023-02-22 NOTE — Telephone Encounter (Signed)
 I returned call to pt. She is aware of the date/time change and instructions. She is ok with this and will call back with any questions or concerns.  She is scheduled on 3/6 at 10:00 AM.

## 2023-02-24 DIAGNOSIS — R002 Palpitations: Secondary | ICD-10-CM | POA: Diagnosis not present

## 2023-02-24 DIAGNOSIS — Z01818 Encounter for other preprocedural examination: Secondary | ICD-10-CM | POA: Diagnosis not present

## 2023-02-25 LAB — CBC
Hematocrit: 43.7 % (ref 34.0–46.6)
Hemoglobin: 14.6 g/dL (ref 11.1–15.9)
MCH: 28.9 pg (ref 26.6–33.0)
MCHC: 33.4 g/dL (ref 31.5–35.7)
MCV: 86 fL (ref 79–97)
Platelets: 258 10*3/uL (ref 150–450)
RBC: 5.06 x10E6/uL (ref 3.77–5.28)
RDW: 13.2 % (ref 11.7–15.4)
WBC: 5.4 10*3/uL (ref 3.4–10.8)

## 2023-02-25 LAB — BASIC METABOLIC PANEL
BUN/Creatinine Ratio: 20 (ref 9–23)
BUN: 18 mg/dL (ref 6–24)
CO2: 27 mmol/L (ref 20–29)
Calcium: 9 mg/dL (ref 8.7–10.2)
Chloride: 102 mmol/L (ref 96–106)
Creatinine, Ser: 0.89 mg/dL (ref 0.57–1.00)
Glucose: 88 mg/dL (ref 70–99)
Potassium: 3.6 mmol/L (ref 3.5–5.2)
Sodium: 141 mmol/L (ref 134–144)
eGFR: 77 mL/min/{1.73_m2} (ref >59)

## 2023-03-09 DIAGNOSIS — Z85828 Personal history of other malignant neoplasm of skin: Secondary | ICD-10-CM | POA: Diagnosis not present

## 2023-03-09 DIAGNOSIS — D225 Melanocytic nevi of trunk: Secondary | ICD-10-CM | POA: Diagnosis not present

## 2023-03-09 DIAGNOSIS — L814 Other melanin hyperpigmentation: Secondary | ICD-10-CM | POA: Diagnosis not present

## 2023-03-09 DIAGNOSIS — L821 Other seborrheic keratosis: Secondary | ICD-10-CM | POA: Diagnosis not present

## 2023-03-17 NOTE — Anesthesia Preprocedure Evaluation (Signed)
 Anesthesia Evaluation  Patient identified by MRN, date of birth, ID band Patient awake    Reviewed: Allergy & Precautions, NPO status , Patient's Chart, lab work & pertinent test results  Airway Mallampati: III  TM Distance: >3 FB Neck ROM: Full    Dental no notable dental hx. (+) Teeth Intact, Dental Advisory Given   Pulmonary asthma    Pulmonary exam normal breath sounds clear to auscultation       Cardiovascular hypertension, Pt. on medications Normal cardiovascular exam+ dysrhythmias (PVCs)  Rhythm:Regular Rate:Normal  TTE 2024  1. Left ventricular ejection fraction, by estimation, is 55 to 60%. The  left ventricle has normal function. The left ventricle has no regional  wall motion abnormalities. Left ventricular diastolic parameters were  normal. The average left ventricular  global longitudinal strain is -19.0 %. The global longitudinal strain is  normal.   2. Right ventricular systolic function is normal. The right ventricular  size is normal.   3. The mitral valve is normal in structure. Trivial mitral valve  regurgitation. No evidence of mitral stenosis.   4. The aortic valve is tricuspid. Aortic valve regurgitation is not  visualized. No aortic stenosis is present.   5. The inferior vena cava is normal in size with greater than 50%  respiratory variability, suggesting right atrial pressure of 3 mmHg.   Cath 2024 1. Normal coronary anatomy 2. Normal LV function 3. Normal LVEDP    Neuro/Psych  Headaches  negative psych ROS   GI/Hepatic negative GI ROS, Neg liver ROS,,,  Endo/Other  negative endocrine ROS    Renal/GU negative Renal ROS  negative genitourinary   Musculoskeletal negative musculoskeletal ROS (+)    Abdominal   Peds  Hematology negative hematology ROS (+)   Anesthesia Other Findings   Reproductive/Obstetrics                             Anesthesia  Physical Anesthesia Plan  ASA: 2  Anesthesia Plan: MAC   Post-op Pain Management: Minimal or no pain anticipated   Induction: Intravenous  PONV Risk Score and Plan: 2 and Propofol infusion, Treatment may vary due to age or medical condition, Midazolam and Ondansetron  Airway Management Planned: Natural Airway  Additional Equipment:   Intra-op Plan:   Post-operative Plan:   Informed Consent: I have reviewed the patients History and Physical, chart, labs and discussed the procedure including the risks, benefits and alternatives for the proposed anesthesia with the patient or authorized representative who has indicated his/her understanding and acceptance.     Dental advisory given  Plan Discussed with: CRNA  Anesthesia Plan Comments:        Anesthesia Quick Evaluation

## 2023-03-18 ENCOUNTER — Encounter (HOSPITAL_COMMUNITY)
Admission: RE | Disposition: A | Discharge status: Home / Self Care | Payer: Self-pay | Source: Ambulatory Visit | Attending: Cardiology

## 2023-03-18 ENCOUNTER — Other Ambulatory Visit: Payer: Self-pay

## 2023-03-18 ENCOUNTER — Ambulatory Visit (HOSPITAL_COMMUNITY): Payer: Self-pay | Admitting: Certified Registered Nurse Anesthetist

## 2023-03-18 ENCOUNTER — Ambulatory Visit (HOSPITAL_COMMUNITY)
Admission: RE | Admit: 2023-03-18 | Discharge: 2023-03-19 | Disposition: A | Discharge status: Home / Self Care | Payer: BC Managed Care – PPO | Source: Ambulatory Visit | Attending: Cardiology | Admitting: Cardiology

## 2023-03-18 ENCOUNTER — Encounter (HOSPITAL_COMMUNITY): Payer: Self-pay | Admitting: Cardiology

## 2023-03-18 DIAGNOSIS — I10 Essential (primary) hypertension: Secondary | ICD-10-CM | POA: Diagnosis not present

## 2023-03-18 DIAGNOSIS — I493 Ventricular premature depolarization: Secondary | ICD-10-CM | POA: Diagnosis present

## 2023-03-18 DIAGNOSIS — J45909 Unspecified asthma, uncomplicated: Secondary | ICD-10-CM | POA: Insufficient documentation

## 2023-03-18 HISTORY — PX: PVC ABLATION: EP1236

## 2023-03-18 LAB — PREGNANCY, URINE: Preg Test, Ur: NEGATIVE

## 2023-03-18 SURGERY — PVC ABLATION
Anesthesia: General

## 2023-03-18 MED ORDER — LEVONORGESTREL 20 MCG/24HR IU IUD: 1.0000 | INTRAUTERINE_SYSTEM | Freq: Once | INTRAUTERINE | Status: DC

## 2023-03-18 MED ORDER — OXYCODONE HCL 5 MG PO TABS
ORAL_TABLET | ORAL | Status: AC
  Filled 2023-03-18: qty 1

## 2023-03-18 MED ORDER — EPINEPHRINE 0.3 MG/0.3ML IJ SOAJ: 0.3000 mg | Freq: Once | INTRAMUSCULAR | Status: DC | PRN

## 2023-03-18 MED ORDER — MIDAZOLAM HCL 2 MG/2ML IJ SOLN
INTRAMUSCULAR | Status: DC | PRN
  Administered 2023-03-18: 2 mg via INTRAVENOUS
  Administered 2023-03-18: 1 mg via INTRAVENOUS

## 2023-03-18 MED ORDER — SODIUM CHLORIDE 0.9% FLUSH
3.0000 mL | Freq: Two times a day (BID) | INTRAVENOUS | Status: DC
  Administered 2023-03-18 – 2023-03-19 (×2): 3 mL via INTRAVENOUS

## 2023-03-18 MED ORDER — RIBOFLAVIN 400 MG PO CAPS: 400.0000 mg | ORAL_CAPSULE | Freq: Every day | ORAL | Status: DC

## 2023-03-18 MED ORDER — HEPARIN SODIUM (PORCINE) 1000 UNIT/ML IJ SOLN
INTRAMUSCULAR | Status: AC
  Filled 2023-03-18: qty 10

## 2023-03-18 MED ORDER — ALBUTEROL SULFATE (2.5 MG/3ML) 0.083% IN NEBU: 2.5000 mg | INHALATION_SOLUTION | RESPIRATORY_TRACT | Status: DC | PRN

## 2023-03-18 MED ORDER — CO Q-10 300 MG PO CAPS: 300.0000 mg | ORAL_CAPSULE | Freq: Every day | ORAL | Status: DC

## 2023-03-18 MED ORDER — FENTANYL CITRATE (PF) 100 MCG/2ML IJ SOLN
INTRAMUSCULAR | Status: DC | PRN
Start: 2023-03-18 — End: 2023-03-18
  Administered 2023-03-18: 50 ug via INTRAVENOUS
  Administered 2023-03-18 (×2): 25 ug via INTRAVENOUS

## 2023-03-18 MED ORDER — OXYCODONE HCL 5 MG/5ML PO SOLN: 5.0000 mg | Freq: Once | ORAL | Status: AC | PRN

## 2023-03-18 MED ORDER — POTASSIUM CHLORIDE CRYS ER 20 MEQ PO TBCR
20.0000 meq | EXTENDED_RELEASE_TABLET | Freq: Every day | ORAL | Status: DC
  Administered 2023-03-19: 20 meq via ORAL
  Filled 2023-03-18: qty 1

## 2023-03-18 MED ORDER — HEPARIN SODIUM (PORCINE) 1000 UNIT/ML IJ SOLN
INTRAMUSCULAR | Status: DC | PRN
  Administered 2023-03-18: 1000 [IU] via INTRAVENOUS

## 2023-03-18 MED ORDER — SUMATRIPTAN SUCCINATE 6 MG/0.5ML ~~LOC~~ SOLN: 3.0000 mg | SUBCUTANEOUS | Status: DC | PRN

## 2023-03-18 MED ORDER — BUPIVACAINE HCL (PF) 0.25 % IJ SOLN
INTRAMUSCULAR | Status: DC | PRN
  Administered 2023-03-18: 50 mL

## 2023-03-18 MED ORDER — ONDANSETRON HCL 4 MG/2ML IJ SOLN
4.0000 mg | Freq: Four times a day (QID) | INTRAMUSCULAR | Status: DC | PRN
Start: 2023-03-18 — End: 2023-03-19

## 2023-03-18 MED ORDER — GLUCOSAMINE CHONDR 1500 COMPLX PO CAPS: 2.0000 | ORAL_CAPSULE | Freq: Every day | ORAL | Status: DC

## 2023-03-18 MED ORDER — HEPARIN SODIUM (PORCINE) 1000 UNIT/ML IJ SOLN
INTRAMUSCULAR | Status: DC | PRN
  Administered 2023-03-18: 5000 [IU] via INTRAVENOUS

## 2023-03-18 MED ORDER — ONDANSETRON 4 MG PO TBDP: 8.0000 mg | ORAL_TABLET | Freq: Three times a day (TID) | ORAL | Status: DC | PRN

## 2023-03-18 MED ORDER — SODIUM CHLORIDE 0.9% FLUSH: 3.0000 mL | INTRAVENOUS | Status: DC | PRN

## 2023-03-18 MED ORDER — LORATADINE 10 MG PO TABS
10.0000 mg | ORAL_TABLET | Freq: Every day | ORAL | Status: DC
  Filled 2023-03-18: qty 1

## 2023-03-18 MED ORDER — ACETAMINOPHEN 325 MG PO TABS: 650.0000 mg | ORAL_TABLET | ORAL | Status: DC | PRN

## 2023-03-18 MED ORDER — HYDROCHLOROTHIAZIDE 25 MG PO TABS
25.0000 mg | ORAL_TABLET | Freq: Every day | ORAL | Status: DC
  Administered 2023-03-19: 25 mg via ORAL
  Filled 2023-03-18: qty 1

## 2023-03-18 MED ORDER — SODIUM CHLORIDE 0.9 % IV SOLN: INTRAVENOUS | Status: DC

## 2023-03-18 MED ORDER — MIDAZOLAM HCL 5 MG/5ML IJ SOLN
INTRAMUSCULAR | Status: AC
  Filled 2023-03-18: qty 5

## 2023-03-18 MED ORDER — BUPIVACAINE HCL (PF) 0.25 % IJ SOLN
INTRAMUSCULAR | Status: AC
  Filled 2023-03-18: qty 60

## 2023-03-18 MED ORDER — PROPOFOL 500 MG/50ML IV EMUL
INTRAVENOUS | Status: DC | PRN
  Administered 2023-03-18: 10 mg via INTRAVENOUS
  Administered 2023-03-18 (×2): 20 mg via INTRAVENOUS
  Administered 2023-03-18: 50 ug/kg/min via INTRAVENOUS

## 2023-03-18 MED ORDER — ONDANSETRON HCL 4 MG/2ML IJ SOLN
INTRAMUSCULAR | Status: DC | PRN
  Administered 2023-03-18: 4 mg via INTRAVENOUS

## 2023-03-18 MED ORDER — FENTANYL CITRATE (PF) 100 MCG/2ML IJ SOLN
INTRAMUSCULAR | Status: AC
  Filled 2023-03-18: qty 2

## 2023-03-18 MED ORDER — COLCHICINE 0.6 MG PO TABS
0.6000 mg | ORAL_TABLET | Freq: Once | ORAL | Status: AC
  Administered 2023-03-18: 0.6 mg via ORAL
  Filled 2023-03-18: qty 1

## 2023-03-18 MED ORDER — OXYCODONE HCL 5 MG PO TABS
5.0000 mg | ORAL_TABLET | Freq: Once | ORAL | Status: AC | PRN
  Administered 2023-03-18: 5 mg via ORAL

## 2023-03-18 MED ORDER — HEPARIN (PORCINE) IN NACL 1000-0.9 UT/500ML-% IV SOLN
INTRAVENOUS | Status: DC | PRN
  Administered 2023-03-18 (×2): 500 mL

## 2023-03-18 MED ORDER — ALBUTEROL SULFATE HFA 108 (90 BASE) MCG/ACT IN AERS: 2.0000 | INHALATION_SPRAY | RESPIRATORY_TRACT | Status: DC | PRN

## 2023-03-18 MED ORDER — LACTATED RINGERS IV SOLN: INTRAVENOUS | Status: DC | PRN

## 2023-03-18 MED ORDER — SODIUM CHLORIDE 0.9 % IV SOLN: 250.0000 mL | INTRAVENOUS | Status: DC | PRN

## 2023-03-18 MED ORDER — VITAMIN D 25 MCG (1000 UNIT) PO TABS
2000.0000 [IU] | ORAL_TABLET | Freq: Two times a day (BID) | ORAL | Status: DC
Start: 2023-03-18 — End: 2023-03-19
  Administered 2023-03-18 – 2023-03-19 (×2): 2000 [IU] via ORAL
  Filled 2023-03-18 (×3): qty 2

## 2023-03-18 SURGICAL SUPPLY — 16 items
BAG SNAP BAND KOVER 36X36 (MISCELLANEOUS) IMPLANT
CATH 8FR REPROCESSED SOUNDSTAR (CATHETERS) ×1 IMPLANT
CATH 8FR SOUNDSTAR REPROCESSED (CATHETERS) IMPLANT
CATH ABLAT QDOT MICRO BI TC DF (CATHETERS) IMPLANT
CATH DECANAV F CURVE (CATHETERS) IMPLANT
CATH JOSEPH QUAD ALLRED 6F REP (CATHETERS) IMPLANT
CLOSURE MYNX CONTROL 6F/7F (Vascular Products) IMPLANT
PACK EP LF (CUSTOM PROCEDURE TRAY) ×1 IMPLANT
PAD DEFIB RADIO PHYSIO CONN (PAD) ×1 IMPLANT
PATCH CARTO3 (PAD) IMPLANT
SHEATH PINNACLE 6F 10CM (SHEATH) IMPLANT
SHEATH PINNACLE 7F 10CM (SHEATH) IMPLANT
SHEATH PINNACLE 8F 10CM (SHEATH) IMPLANT
SHEATH PINNACLE 9F 10CM (SHEATH) IMPLANT
SHEATH PROBE COVER 6X72 (BAG) IMPLANT
TUBING SMART ABLATE COOLFLOW (TUBING) IMPLANT

## 2023-03-18 NOTE — Progress Notes (Signed)
 Patient arrived to room [phone]. VS stable and groin sites level 0. Patient oriented to room and call bell in reach.

## 2023-03-18 NOTE — Transfer of Care (Signed)
 Immediate Anesthesia Transfer of Care Note  Patient: Rebecca Martinez  Procedure(s) Performed: PVC ABLATION  Patient Location: Cath Lab  Anesthesia Type:MAC  Level of Consciousness: awake, alert , and oriented  Airway & Oxygen Therapy: Patient Spontanous Breathing and Patient connected to nasal cannula oxygen  Post-op Assessment: Report given to RN, Post -op Vital signs reviewed and stable, Patient moving all extremities X 4, and Patient able to stick tongue midline  Post vital signs: Reviewed and stable  Last Vitals:  Vitals Value Taken Time  BP 146/89 03/18/23 1315  Temp 36.9 C 03/18/23 1316  Pulse 84 03/18/23 1318  Resp 12 03/18/23 1318  SpO2 94 % 03/18/23 1318  Vitals shown include unfiled device data.  Last Pain:  Vitals:   03/18/23 1316  TempSrc: Temporal         Complications: There were no known notable events for this encounter.

## 2023-03-18 NOTE — H&P (Signed)
  Electrophysiology Office Note:   Date:  03/18/2023  ID:  Rebecca Martinez, DOB 1970/03/19, MRN [id number]  Primary Cardiologist: Maisie Fus, MD Electrophysiologist: None      History of Present Illness:   Rebecca Martinez is a 53 y.o. female with h/o PVCs seen today for  for Electrophysiology evaluation of PVCs at the request of Carolan Clines.    She has a history of near syncope.  She was participating in a [place] race.  She has participated in multiple of these races without experiencing similar symptoms.  She felt her heart racing and tingling in her fingers and lips.  Her blood pressure and heart rate were found to be elevated at the time.  She did not lose consciousness.  Today, denies symptoms of palpitations, chest pain, shortness of breath, orthopnea, PND, lower extremity edema, claudication, dizziness, presyncope, syncope, bleeding, or neurologic sequela. The patient is tolerating medications without difficulties. Plan ablation today.   EKG is ordered today. Personal review as below.        Risk Assessment/Calculations:              Physical Exam:   VS:  BP 117/82   Pulse 63   Temp 98.1 F (36.7 C) (Oral)   Resp 18   Ht 5\' 6"  (1.676 m)   Wt 68.9 kg   SpO2 98%   BMI 24.53 kg/m    Wt Readings from Last 3 Encounters:  03/18/23 68.9 kg  02/16/23 70.1 kg  11/17/22 72 kg    GEN: No acute distress.   Neck: No JVD Cardiac: RRR, no murmurs, rubs, or gallops.  Respiratory: decreased BS bases bilaterally. GI: Soft, nontender, non-distended  MS: No edema; No deformity. Neuro:  Nonfocal  Skin: warm and dry Psych: Normal affect    ASSESSMENT AND PLAN:    1.  PVCs: Rebecca Martinez has presented today for surgery, with the diagnosis of PVC.  The various methods of treatment have been discussed with the patient and family. After consideration of risks, benefits and other options for treatment, the patient has consented to  Procedure(s): Catheter ablation as a  surgical intervention .  Risks include but not limited to complete heart block, stroke, esophageal damage, nerve damage, bleeding, vascular damage, tamponade, perforation, MI, and death. The patient's history has been reviewed, patient examined, no change in status, stable for surgery.  I have reviewed the patient's chart and labs.  Questions were answered to the patient's satisfaction.    Rebecca Paradiso Elberta Fortis, MD 03/18/2023 9:32 AM

## 2023-03-18 NOTE — Plan of Care (Signed)
  Problem: Education: Goal: Knowledge of General Education information will improve Description: Including pain rating scale, medication(s)/side effects and non-pharmacologic comfort measures Outcome: Progressing   Problem: Health Behavior/Discharge Planning: Goal: Ability to manage health-related needs will improve Outcome: Progressing   Problem: Clinical Measurements: Goal: Ability to maintain clinical measurements within normal limits will improve Outcome: Progressing Goal: Will remain free from infection Outcome: Progressing Goal: Diagnostic test results will improve Outcome: Progressing Goal: Respiratory complications will improve Outcome: Progressing Goal: Cardiovascular complication will be avoided Outcome: Progressing   Problem: Activity: Goal: Risk for activity intolerance will decrease Outcome: Progressing   Problem: Nutrition: Goal: Adequate nutrition will be maintained Outcome: Progressing   Problem: Coping: Goal: Level of anxiety will decrease Outcome: Progressing   Problem: Elimination: Goal: Will not experience complications related to bowel motility Outcome: Progressing  Problem: Pain Managment: Goal: General experience of comfort will improve and/or be controlled Outcome: Progressing   Problem: Safety: Goal: Ability to remain free from injury will improve Outcome: Progressing   Problem: Skin Integrity: Goal: Risk for impaired skin integrity will decrease Outcome: Progressing   Problem: Education: Goal: Understanding of disease, treatment, and recovery process will improve Outcome: Progressing   Problem: Activity: Goal: Ability to return to baseline activity level will improve Outcome: Progressing   Problem: Cardiac: Goal: Ability to maintain adequate cardiovascular perfusion will improve Outcome: Progressing Goal: Vascular access site(s) Level 0-1 will be maintained Outcome: Progressing   Problem: Cardiac: Goal: Ability to maintain  adequate cardiovascular perfusion will improve Outcome: Progressing Goal: Vascular access site(s) Level 0-1 will be maintained Outcome: Progressing   Problem: Health Behavior/ Discharge Planning: Goal: Ability to safely manage health related needs after discharge Outcome: Progressing

## 2023-03-18 NOTE — Progress Notes (Signed)
Patient states she feels much better now.

## 2023-03-19 DIAGNOSIS — I493 Ventricular premature depolarization: Secondary | ICD-10-CM | POA: Diagnosis not present

## 2023-03-19 DIAGNOSIS — J45909 Unspecified asthma, uncomplicated: Secondary | ICD-10-CM | POA: Diagnosis not present

## 2023-03-19 DIAGNOSIS — I10 Essential (primary) hypertension: Secondary | ICD-10-CM | POA: Diagnosis not present

## 2023-03-19 NOTE — Progress Notes (Signed)
Discharge instructions provided to patient. All medications, follow up appointments, and discharge instructions provided. IV out. Monitor off CCMD notified. Discharging home.

## 2023-03-19 NOTE — TOC CM/SW Note (Signed)
 Transition of Care [facility]) - Inpatient Brief Assessment   Patient Details  Name: AJANAY FARVE MRN: [id number] Date of Birth: [birth year]  Transition of Care [facility]) CM/SW Contact:    Gala Lewandowsky, RN Phone Number: 03/19/2023, 10:29 AM   Clinical Narrative: Patient post ablation. PTA patient was independent from home. No home needs identified at this time. Patient has transportation home.    Transition of Care Asessment: Insurance and Status: Insurance coverage has been reviewed Patient has primary care physician: Yes Home environment has been reviewed: reviewed Prior level of function:: independent Prior/Current Home Services: No current home services Social Drivers of Health Review: SDOH reviewed no interventions necessary Readmission risk has been reviewed: Yes Transition of care needs: no transition of care needs at this time

## 2023-03-19 NOTE — Discharge Instructions (Signed)

## 2023-03-19 NOTE — Discharge Summary (Signed)
 ELECTROPHYSIOLOGY PROCEDURE DISCHARGE SUMMARY    Patient ID: Rebecca Martinez,  MRN: [id number], DOB/AGE: 1970-04-17 53 y.o.  Admit date: 03/18/2023 Discharge date: 03/19/2023  Primary Care Physician: Camie Patience, FNP Primary Cardiologist: Dr. Wyline Mood Electrophysiologist: Dr. Elberta Fortis  Primary Discharge Diagnosis:  Frequent PVCs  Secondary Discharge Diagnosis:  none  Procedures This Admission:  1.  Electrophysiology study and radiofrequency catheter ablation on by Dr Elberta Fortis   This study demonstrated    CONCLUSIONS:  1. Sinus rhythm upon presentation.  2.  Ablation of PVCs in the right ventricular outflow tract 3. No inducible arrhythmias following ablation.  4. No early apparent complications.     Brief HPI: Rebecca Martinez is a 53 y.o. female with a history of frequent PVCs.  Risks, benefits, and alternatives to catheter ablation were reviewed with the patient who wished to proceed.    Hospital Course:  The patient was admitted and underwent EPS/RFCA her PVCs, unablle to eliminate her clinical PVC though did get a reduction in frequency.  See procedure report for full details.  She was monitored on telemetry overnight which demonstrated SR with only occasional PVCs.  Groin sites are without complication on the day of discharge.  The patient feels well today, denies CP, SOB, no unexpected site discomfort.  She was examined by Dr. Lalla Brothers and considered to be stable for discharge.  Wound care and restrictions were reviewed with the patient.  The patient will be seen back by the EP team 4 weeks   Stop flecainide  Physical Exam: Vitals:   03/18/23 1653 03/18/23 1948 03/19/23 0312 03/19/23 0832  BP: (!) 152/87 135/85 118/62 124/69  Pulse: 64  65 82  Resp: 16 13 16 16   Temp: 98.7 F (37.1 C) 98.5 F (36.9 C) 98.3 F (36.8 C) 98.5 F (36.9 C)  TempSrc: Oral Oral Oral Oral  SpO2: 100% 100%  98%  Weight:      Height:        GEN- The patient is well appearing,  alert and oriented x 3 today.   HEENT: normocephalic, atraumatic; sclera clear, conjunctiva pink; hearing intact; oropharynx clear; neck supple  Lungs- CTA b/l, normal work of breathing.  No wheezes, rales, rhonchi Heart- RRR, no murmurs, rubs or gallops  GI- soft, non-tender, non-distended, bowel sounds present  Extremities- no clubbing, cyanosis, or edema; b/l groins are without hematoma/bruit MS- no significant deformity or atrophy Skin- warm and dry, no rash or lesion Psych- euthymic mood, full affect Neuro- strength and sensation are intact   Labs:   Lab Results  Component Value Date   WBC 5.4 02/24/2023   HGB 14.6 02/24/2023   HCT 43.7 02/24/2023   MCV 86 02/24/2023   PLT 258 02/24/2023   No results for input(s): "NA", "K", "CL", "CO2", "BUN", "CREATININE", "CALCIUM", "PROT", "BILITOT", "ALKPHOS", "ALT", "AST", "GLUCOSE" in the last 168 hours.  Invalid input(s): "LABALBU"   Discharge Medications:  Allergies as of 03/19/2023       Reactions   Shrimp (diagnostic) Nausea And Vomiting   Codeine Nausea Only   Tape    Redness    Cephalexin Rash   Doxycycline Hyclate Rash   Milk-related Compounds Rash   Penicillins Rash        Medication List     STOP taking these medications    flecainide 50 MG tablet Commonly known as: TAMBOCOR       TAKE these medications    albuterol 108 (90 Base) MCG/ACT inhaler Commonly  known as: VENTOLIN HFA Inhale 2 puffs into the lungs every 4 (four) hours as needed for wheezing or shortness of breath (coughing).   B-2-400 400 MG Caps Generic drug: Riboflavin Take 400 mg by mouth at bedtime.   Co Q-10 300 MG Caps Take 300 mg by mouth at bedtime.   EPINEPHrine 0.3 mg/0.3 mL Soaj injection Commonly known as: EPI-PEN Inject 0.3 mg into the muscle once.   fexofenadine 180 MG tablet Commonly known as: ALLEGRA Take 180 mg by mouth daily as needed for allergies or rhinitis.   Glucosamine Chondr 1500 Complx Caps Take 2 capsules  by mouth daily.   hydrochlorothiazide 25 MG tablet Commonly known as: HYDRODIURIL Take 25 mg by mouth daily.   levonorgestrel 20 MCG/24HR IUD Commonly known as: MIRENA 1 each by Intrauterine route once.   MAGNESIUM CITRATE PO Take 150 mg by mouth at bedtime.   OMEGA 3 500 PO Take 1,000 mg by mouth daily.   ondansetron 8 MG disintegrating tablet Commonly known as: ZOFRAN-ODT Take 8 mg by mouth every 8 (eight) hours as needed for nausea or vomiting.   OVER THE COUNTER MEDICATION Take 1 capsule by mouth 2 (two) times daily. Cortisol calm supplement   potassium chloride SA 20 MEQ tablet Commonly known as: KLOR-CON M Take 20 mEq by mouth daily.   PRESCRIPTION MEDICATION Apply 1 Application topically daily. Hormone replacement therapy Estrogen-progesterone cream   Vitamin D 50 MCG (2000 UT) tablet Take 2,000 Units by mouth 2 (two) times daily.   Zembrace SymTouch 3 MG/0.5ML Soaj Generic drug: SUMAtriptan Succinate Inject 3 mg into the skin as needed (May repeat after 1 hour.  Maximum 2 injections in 24 hours.).        Disposition: home Discharge Instructions     Diet - low sodium heart healthy   Complete by: As directed    Increase activity slowly   Complete by: As directed         Duration of Discharge Encounter: 15 minutes, APP time.  Norma Fredrickson, PA-C 03/19/2023 9:49 AM

## 2023-03-19 NOTE — Anesthesia Postprocedure Evaluation (Signed)
 Anesthesia Post Note  Patient: OKSANA DEBERRY  Procedure(s) Performed: PVC ABLATION     Patient location during evaluation: PACU Anesthesia Type: MAC Level of consciousness: awake and alert Pain management: pain level controlled Vital Signs Assessment: post-procedure vital signs reviewed and stable Respiratory status: spontaneous breathing, nonlabored ventilation, respiratory function stable and patient connected to nasal cannula oxygen Cardiovascular status: stable and blood pressure returned to baseline Postop Assessment: no apparent nausea or vomiting Anesthetic complications: no  There were no known notable events for this encounter.  Last Vitals:  Vitals:   03/18/23 1948 03/19/23 0312  BP: 135/85 118/62  Pulse:  65  Resp: 13 16  Temp: 36.9 C 36.8 C  SpO2: 100%     Last Pain:  Vitals:   03/19/23 0312  TempSrc: Oral  PainSc:                  Maleik Vanderzee L Ingram Onnen

## 2023-03-22 MED FILL — Midazolam HCl Inj 5 MG/5ML (Base Equivalent): INTRAMUSCULAR | Qty: 3 | Status: AC

## 2023-04-11 NOTE — Progress Notes (Unsigned)
  Cardiology Office Note:  .   Date:  04/11/2023  ID:  Rebecca Martinez, DOB 1970/11/11, MRN [id number] PCP: Camie Patience, FNP  [facility] Providers Cardiologist:  Maisie Fus, MD {  History of Present Illness: .   Rebecca Martinez is a 53 y.o. 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 -Her Zio patch did show 14% PVCs which is a high burden. EKG localizes PVCs to RVOT Started on BB though this impacted her exercise  Referred to EP, saw Dr. Elberta Fortis, started on flecainide and planned for ablation  PVC ablation 03/18/23   Today's visit is scheduled as her 4 week post ablation visit  ROS:   Did have a large area of R thigh bruising that settled away, otherwise no site observations/concerns She still doedn't feel like she has the same energy as she used to No CP, palpitations (Was unaware of the PVCs, never felt them in her chest/heart beat) No near syncope or syncope No SOB  She has gotten on the treadmill some, walked as well, has not gone back to her usual workouts yet   Arrhythmia/AAD hx PVCs Flecainide started Nov 2024 >>> stopped post ablation RVOT/PVC ablation 03/18/23  Studies Reviewed: Marland Kitchen    EKG done today and reviewed by myself:  SR 66bom, normal   03/18/23: EPS/ablation CONCLUSIONS:  1. Sinus rhythm upon presentation.  2.  Ablation of PVCs in the right ventricular outflow tract 3. No inducible arrhythmias following ablation.  4. No early apparent complications.    Risk Assessment/Calculations:    Physical Exam:   VS:  There were no vitals taken for this visit.   Wt Readings from Last 3 Encounters:  03/18/23 152 lb (68.9 kg)  02/16/23  154 lb 9.6 oz (70.1 kg)  11/17/22 158 lb 12.8 oz (72 kg)    GEN: Well nourished, well developed in no acute distress NECK: No JVD; No carotid bruits CARDIAC: RRR, no murmurs, rubs, gallops RESPIRATORY:   CTA b/l without rales, wheezing or rhonchi  ABDOMEN: Soft, non-tender, non-distended EXTREMITIES:  No edema; No deformity   ASSESSMENT AND PLAN: .    PVCs RVOT PVC ablation  Remains with reduced energy Will go ahead and monitor her a couple weeks as she gets back into her work outs/routine No PVCs on her EKG or exam today If no PVCs, HR/rhythm OK would refer to her PMD to evaluate for other causes of fatigue She mentions this is the longest she has ever gone without being in the gym, exercising     Dispo: back otherwise in 4-6 mo, sooner if needed pending monitor findings  Signed, Sheilah Pigeon, PA-C

## 2023-04-15 ENCOUNTER — Ambulatory Visit: Attending: Physician Assistant | Admitting: Physician Assistant

## 2023-04-15 ENCOUNTER — Ambulatory Visit: Attending: Physician Assistant

## 2023-04-15 ENCOUNTER — Encounter: Payer: Self-pay | Admitting: Physician Assistant

## 2023-04-15 VITALS — BP 132/98 | HR 78 | Ht 66.0 in | Wt 158.0 lb

## 2023-04-15 DIAGNOSIS — R5383 Other fatigue: Secondary | ICD-10-CM | POA: Diagnosis not present

## 2023-04-15 DIAGNOSIS — R002 Palpitations: Secondary | ICD-10-CM

## 2023-04-15 DIAGNOSIS — I493 Ventricular premature depolarization: Secondary | ICD-10-CM

## 2023-04-15 NOTE — Patient Instructions (Signed)
 Medication Instructions:   Your physician recommends that you continue on your current medications as directed. Please refer to the Current Medication list given to you today.   *If you need a refill on your cardiac medications before your next appointment, please call your pharmacy*   Lab Work: NONE ORDERED  TODAY    If you have labs (blood work) drawn today and your tests are completely normal, you will receive your results only by: MyChart Message (if you have MyChart) OR A paper copy in the mail If you have any lab test that is abnormal or we need to change your treatment, we will call you to review the results.  Testing/Procedures:  Your physician has recommended that you wear an event monitor. Event monitors are medical devices that record the heart's electrical activity. Doctors most often [place] these monitors to diagnose arrhythmias. Arrhythmias are problems with the speed or rhythm of the heartbeat. The monitor is a small, portable device. You can wear one while you do your normal daily activities. This is usually used to diagnose what is causing palpitations/syncope (passing out).  ZIO XT- Long Term Monitor Instructions  Your physician has requested you wear a ZIO patch monitor for 14 days.  This is a single patch monitor. [employer] supplies one patch monitor per enrollment. Additional stickers are not available. Please do not apply patch if you will be having a Nuclear Stress Test,  Echocardiogram, Cardiac CT, MRI, or Chest Xray during the period you would be wearing the  monitor. The patch cannot be worn during these tests. You cannot remove and re-apply the  ZIO XT patch monitor.  Your ZIO patch monitor will be mailed 3 day USPS to your address on file. It may take 3-5 days  to receive your monitor after you have been enrolled.  Once you have received your monitor, please review the enclosed instructions. Your monitor  has already been registered assigning a specific monitor  serial # to you.  Billing and Patient Assistance Program Information  We have supplied [employer] with any of your insurance information on file for billing purposes. [employer] offers a sliding scale Patient Assistance Program for patients that do not have  insurance, or whose insurance does not completely cover the cost of the ZIO monitor.  You must apply for the Patient Assistance Program to qualify for this discounted rate.  To apply, please call [employer] at [phone], select option 4, select option 2, ask to apply for  Patient Assistance Program. Meredeth Ide will ask your household income, and how many people  are in your household. They will quote your out-of-pocket cost based on that information.  [employer] will also be able to set up a 13-month, interest-free payment plan if needed.  Applying the monitor   Shave hair from upper left chest.  Hold abrader disc by orange tab. Rub abrader in 40 strokes over the upper left chest as  indicated in your monitor instructions.  Clean area with 4 enclosed alcohol pads. Let dry.  Apply patch as indicated in monitor instructions. Patch will be placed under collarbone on left  side of chest with arrow pointing upward.  Rub patch adhesive wings for 2 minutes. Remove white label marked "1". Remove the white  label marked "2". Rub patch adhesive wings for 2 additional minutes.  While looking in a mirror, press and release button in center of patch. A small green light will  flash 3-4 times. This will be your only indicator that the monitor has  been turned on.  Do not shower for the first 24 hours. You may shower after the first 24 hours.  Press the button if you feel a symptom. You will hear a small click. Record Date, Time and  Symptom in the Patient Logbook.  When you are ready to remove the patch, follow instructions on the last 2 pages of Patient  Logbook. Stick patch monitor onto the last page of Patient Logbook.  Place Patient Logbook in the blue  and white box. Use locking tab on box and tape box closed  securely. The blue and white box has prepaid postage on it. Please place it in the mailbox as  soon as possible. Your physician should have your test results approximately 7 days after the  monitor has been mailed back to [facility].  Call [facility] Customer Care at [phone] if you have questions regarding  your ZIO XT patch monitor. Call them immediately if you see an orange light blinking on your  monitor.  If your monitor falls off in less than 4 days, contact our Monitor department at [phone].  If your monitor becomes loose or falls off after 4 days call [employer] at [phone] for  suggestions on securing your monitor   Follow-Up: At [facility], you and your health needs are our priority.  As part of our continuing mission to provide you with exceptional heart care, our providers are all part of one team.  This team includes your primary Cardiologist (physician) and Advanced Practice Providers or APPs (Physician Assistants and Nurse Practitioners) who all work together to provide you with the care you need, when you need it.  Your next appointment:    4 -6 month(s) ( CONTACT  CASSIE HALL/ ANGELINE HAMMER FOR EP SCHEDULING ISSUES )   Provider:    You may see Dr Elberta Fortis  or one of the following Advanced Practice Providers on your designated Care Team:   Francis Dowse, [place]  We recommend signing up for the patient portal called "MyChart".  Sign up information is provided on this After Visit Summary.  MyChart is used to connect with patients for Virtual Visits (Telemedicine).  Patients are able to view lab/test results, encounter notes, upcoming appointments, etc.  Non-urgent messages can be sent to your provider as well.   To learn more about what you can do with MyChart, go to [email or web address].   Other Instructions       1st Floor: - Lobby - Registration  - Pharmacy  - Lab -  Cafe  2nd Floor: - PV Lab - Diagnostic Testing (echo, CT, nuclear med)  3rd Floor: - Vacant  4th Floor: - TCTS (cardiothoracic surgery) - AFib Clinic - Structural Heart Clinic - Vascular Surgery  - Vascular Ultrasound  5th Floor: - HeartCare Cardiology (general and EP) - Clinical Pharmacy for coumadin, hypertension, lipid, weight-loss medications, and med management appointments    Valet parking services will be available as well.

## 2023-04-15 NOTE — Progress Notes (Unsigned)
 Enrolled for [employer] to mail a ZIO XT long term holter monitor to the patients address on file.   Dr. Elberta Fortis to read.

## 2023-05-06 DIAGNOSIS — Z1231 Encounter for screening mammogram for malignant neoplasm of breast: Secondary | ICD-10-CM | POA: Diagnosis not present

## 2023-05-13 DIAGNOSIS — I493 Ventricular premature depolarization: Secondary | ICD-10-CM

## 2023-07-27 DIAGNOSIS — Z79890 Hormone replacement therapy (postmenopausal): Secondary | ICD-10-CM | POA: Diagnosis not present

## 2023-07-27 DIAGNOSIS — Z01419 Encounter for gynecological examination (general) (routine) without abnormal findings: Secondary | ICD-10-CM | POA: Diagnosis not present

## 2023-07-27 DIAGNOSIS — Z780 Asymptomatic menopausal state: Secondary | ICD-10-CM | POA: Diagnosis not present

## 2023-07-27 DIAGNOSIS — Z113 Encounter for screening for infections with a predominantly sexual mode of transmission: Secondary | ICD-10-CM | POA: Diagnosis not present

## 2023-07-28 DIAGNOSIS — T7840XA Allergy, unspecified, initial encounter: Secondary | ICD-10-CM | POA: Diagnosis not present

## 2023-07-28 DIAGNOSIS — R41840 Attention and concentration deficit: Secondary | ICD-10-CM | POA: Diagnosis not present

## 2023-08-03 DIAGNOSIS — F419 Anxiety disorder, unspecified: Secondary | ICD-10-CM | POA: Diagnosis not present

## 2023-08-03 DIAGNOSIS — R41840 Attention and concentration deficit: Secondary | ICD-10-CM | POA: Diagnosis not present

## 2023-08-03 DIAGNOSIS — E785 Hyperlipidemia, unspecified: Secondary | ICD-10-CM | POA: Diagnosis not present

## 2023-08-03 DIAGNOSIS — E559 Vitamin D deficiency, unspecified: Secondary | ICD-10-CM | POA: Diagnosis not present

## 2023-08-03 DIAGNOSIS — I10 Essential (primary) hypertension: Secondary | ICD-10-CM | POA: Diagnosis not present

## 2023-08-03 DIAGNOSIS — Z0000 Encounter for general adult medical examination without abnormal findings: Secondary | ICD-10-CM | POA: Diagnosis not present

## 2023-08-15 NOTE — Progress Notes (Deleted)
 Cardiology Office Note:  .   Date:  08/15/2023  ID:  Rebecca Martinez, DOB May 11, 1970, MRN [id number] PCP: Rebecca Anthony RAMAN, FNP  [facility] Providers Cardiologist:  Alvan Ronal BRAVO, MD (Inactive) {  History of Present Illness: .   Rebecca Martinez is a 53 y.o. 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 -Her Zio patch did show 14% PVCs which is a high burden. EKG localizes PVCs to RVOT Started on BB though this impacted her exercise  Referred to EP, saw Dr. Inocencio, started on flecainide  and planned for ablation  PVC ablation 03/18/23  I saw her 04/15/23 Did have a large area of R thigh bruising that settled away, otherwise no site observations/concerns She still doedn't feel like she has the same energy as she used to No CP, palpitations (Was unaware of the PVCs, never felt them in her chest/heart beat) No near syncope or syncope No SOB She has gotten on the treadmill some, walked as well, has not gone back to her usual workouts yet No PVCs on EKG or exam, planned to monitor for PVC/burden given her persistent fatigue  PVC burden down to 5.7% Symptom trigger episodes correspond to sinus rhythm and sinus rhythm with PVCs    Today's visit is scheduled as a 6mo f/u ROS:   *** better? *** symptoms   Arrhythmia/AAD hx PVCs Flecainide  started Nov 2024 >>> stopped post ablation RVOT/PVC ablation 03/18/23  Studies Reviewed: SABRA    EKG done today and reviewed by myself:  ***  April 2025 monitor HR 42 - 154, average 72 bpm. 3 nonsustained SVT (longest 5 beats) No atrial fibrillation detected. Rare supraventricular ectopy. Frequent ventricular ectopy, 5.7%. No  sustained arrhythmias. Symptom trigger episodes correspond to sinus rhythm and sinus rhythm with PVCs   03/18/23: EPS/ablation CONCLUSIONS:  1. Sinus rhythm upon presentation.  2.  Ablation of PVCs in the right ventricular outflow tract 3. No inducible arrhythmias following ablation.  4. No early apparent complications.    10/05/22: TTE 1. Left ventricular ejection fraction, by estimation, is 55 to 60%. The  left ventricle has normal function. The left ventricle has no regional  wall motion abnormalities. Left ventricular diastolic parameters were  normal. The average left ventricular  global longitudinal strain is -19.0 %. The global longitudinal strain is  normal.   2. Right ventricular systolic function is normal. The right ventricular  size is normal.   3. The mitral valve is normal in structure. Trivial mitral valve  regurgitation. No evidence of mitral stenosis.   4. The aortic valve is tricuspid. Aortic valve regurgitation is not  visualized. No aortic stenosis is present.   5. The inferior vena cava is normal in size with greater than 50%  respiratory variability, suggesting right atrial pressure of 3 mmHg.   Comparison(s): No prior Echocardiogram.    09/10/22: LHC  The left ventricular systolic function is normal.   LV end diastolic pressure is normal.   The left ventricular ejection fraction is 55-65% by visual estimate.   Normal coronary anatomy Normal LV function Normal LVEDP  Risk Assessment/Calculations:    Physical Exam:   VS:  There were no vitals taken for this visit.   Wt Readings from  Last 3 Encounters:  04/15/23 158 lb (71.7 kg)  03/18/23 152 lb (68.9 kg)  02/16/23 154 lb 9.6 oz (70.1 kg)    GEN: Well nourished, well developed in no acute distress NECK: No JVD; No carotid bruits CARDIAC: ***RRR, no murmurs, rubs, gallops RESPIRATORY: *** CTA b/l without rales, wheezing or rhonchi  ABDOMEN: Soft, non-tender, non-distended EXTREMITIES: ***No edema;  No deformity   ASSESSMENT AND PLAN: .    PVCs RVOT PVC ablation ***     Dispo: back in *** mo, sooner if needed pending monitor findings  Signed, Charlies Macario Arthur, PA-C

## 2023-08-17 ENCOUNTER — Ambulatory Visit: Attending: Cardiovascular Disease | Admitting: Cardiology

## 2023-08-17 ENCOUNTER — Encounter: Payer: Self-pay | Admitting: Cardiology

## 2023-08-17 ENCOUNTER — Ambulatory Visit: Admitting: Physician Assistant

## 2023-08-17 VITALS — BP 122/74 | HR 79 | Ht 66.0 in | Wt 155.0 lb

## 2023-08-17 DIAGNOSIS — R5383 Other fatigue: Secondary | ICD-10-CM

## 2023-08-17 DIAGNOSIS — I493 Ventricular premature depolarization: Secondary | ICD-10-CM | POA: Diagnosis not present

## 2023-08-17 DIAGNOSIS — I10 Essential (primary) hypertension: Secondary | ICD-10-CM

## 2023-08-17 NOTE — Progress Notes (Signed)
 Cardiology Office Note:  .   Date:  08/22/2023  ID:  Rebecca Martinez, DOB [birth year], MRN [id number] PCP: Dyane Anthony RAMAN, FNP  [facility] Providers Cardiologist:  Will Gladis Norton, MD Cardiology APP:  Leverne Charlies [place], [place]     Chief Complaint  Patient presents with   Follow-up    General cardiology follow-up.  Former patient of Dr. Alvan.  Had been followed by EP in the interim.   Palpitations    Status post PVC ablation for frequent PVCs.  Seems to be doing well but just noting fatigue.    Patient Profile: .     Rebecca Martinez is a  53 y.o. female  with a PMH notable for frequent PVCs-s/p PVC ablation who presents here for primary cardiology follow-up at the request of Dyane Anthony RAMAN, FNP. - Former patient of Dr. Ronal Alvan referred to EP.  Freq PVCs  on Monitor 08/2022  - PVCs on ETT 8/22 => Normal Coronaries on Cath = PVC Ablation 03/18/23; no longer on flecainide  or beta-blocker.  Frequency reduced from 14.4% to 5.7% post ablation    Rebecca Martinez was last seen on April 15, 2023 by Charlies Leverne, PA-C on April 15, 2023 as a 4-week post ablation follow-up.  Other than thigh bruising issues.  The main issue noted was that she did not feel the same out of energy that she used to.  But denied any chest pain palpitations symptoms.  She had started on the treadmill but had not yet gotten the full exercise.  Plan was for 4 to 31-month follow-up.  Subjective  Discussed the use of AI scribe software for clinical note transcription with the patient, who gave verbal consent to proceed.  History of Present Illness  Rebecca Martinez is a 53 year old female with a history of PVC ablation who presents with post-procedural fatigue and exercise intolerance.  She actually was supposed to be seen both by EP and me today.  Decision was made that she would see me.  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  Prior to her  diagnosis of PVCs last summer she was exercising vigorously with running, lifting weights and doing CrossFit training. Symptoms were first noticed last August during a [place] race, becoming more evident during treadmill exercises. Post-ablation, she has been hesitant to push herself due to fear of exacerbating symptoms. She describes feeling 'pushed back' or 'slowed down' during physical activities, although she has not experienced any pass-out spells or dizziness.  She has been experiencing significant fatigue and exercise intolerance following a cardiac ablation procedure performed in March. Initially, she felt extremely tired for the first three months post-ablation and continues to feel more winded and slower than usual during exertion, such as climbing stairs or engaging in cardio exercises. She has not returned to her pre-ablation fitness level and is cautious about pushing herself during workouts. A follow-up monitor in April showed PVCs at 5.7%. She has been gradually returning to exercise, including lifting weights two to three times a week and walking, but has not yet resumed treadmill workouts. She is concerned about her ability to return to her previous level of physical activity.  She is currently on flecainide , which makes her feel sluggish, and HCTZ for blood pressure. She was briefly on metoprolol  but felt weak and tired while taking it. No symptoms are noted at rest, only during significant exertion.   Cardiovascular ROS: no chest pain or dyspnea  on exertion positive for - not really exertional dyspnea, just fatigue, feels sluggish and weak,, lethargic.  Energy is better since stopping beta-blocker. negative for - chest pain, dyspnea on exertion, edema, irregular heartbeat, orthopnea, paroxysmal nocturnal dyspnea, rapid heart rate, shortness of breath, or lightheadedness dizziness or wooziness, syncope or near see, TIA or emesis begets.  Claudication.    Objective    Social  History: The patient is physically active, engages in lifting and walking exercises, and has a history of participating in [place] races. She has three adult children and has been athletic and active throughout her life.  Family History includes a grandmother with hypertension who eventually required a pacemaker and defibrillator.  Studies Reviewed: .        4-day Zio patch: Heart rate range 46 to 142 bpm with an average of 142 bpm.  Rare PACs with rare couplets and triplets.  Frequent (41%) PVCs with rare couplets and triplets.  Ventricular bigeminy and trigeminy noted.  ECHO: EF 55 to 60%.  No RWMA.  Normal RV.  Normal aortic and mitral valves.  NORMAL STUDY (10/05/2022) Left Heart Cath and Coronaries: Performed to evaluate frequent PVCs on GXT.  Normal coronary arteries with normal LV function and normal LVEDP.  (09/10/2022)  14 Zio 04/2023:  HR 42 - 154, average 72 bpm.; 3 nonsustained SVT (longest 5 beats); No atrial fibrillation detected. Rare supraventricular ectopy. Frequent ventricular ectopy, 5.7%. No sustained arrhythmias. Symptom trigger episodes correspond to sinus rhythm and sinus rhythm with PVCs  Risk Assessment/Calculations:          Physical Exam:   VS:  BP 122/74 (BP Location: Left Arm, Patient Position: Sitting, Cuff Size: Normal)   Pulse 79   Ht 5' 6 (1.676 m)   Wt 155 lb (70.3 kg)   SpO2 99%   BMI 25.02 kg/m    Wt Readings from Last 3 Encounters:  08/17/23 155 lb (70.3 kg)  04/15/23 158 lb (71.7 kg)  03/18/23 152 lb (68.9 kg)    GEN: Healthy appearing.  Well nourished, well groomed in no acute distress;  NECK: No JVD; No carotid bruits CARDIAC: RRR with rare ectopy. Normal S1, S2; no murmurs, rubs, gallops RESPIRATORY:  Clear to auscultation without rales, wheezing or rhonchi ; nonlabored, good air movement. ABDOMEN: Soft, non-tender, non-distended EXTREMITIES:  No edema; No deformity     ASSESSMENT AND PLAN: .    Problem List Items Addressed This Visit        Cardiology Problems   Primary hypertension (Chronic)   Well-controlled BP on HCTZ alone.  Did not tolerate beta-blocker.      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 No longer on beta-blocker or flecainide . Overall most notable symptom is fatigue And exercise intolerance.  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  Ablation did not eliminate all PVCs due to proximity to heart's electrical system. Current PVC level not dangerous, below 10% threshold. - Will plan to message Dr. Inocencio regarding exercise restrictions and potential stress testing. - Advise gradual increase in exercise intensity, monitor for symptoms. - Consider medication if symptoms worsen with increased activity.        Other   Fatigue   Persistent fatigue post-ablation, improving but not at pre-ablation levels. Likely related to ablation procedure and flecainide  use. - Advise gradual return to exercise, monitor for fatigue. - Discuss potential medication adjustments if fatigue persists.  Follow-Up: Return in about 1 year (around 08/16/2024) for Routine follow up with me.      Signed, Alm MICAEL Clay, MD, MS Alm Clay, M.D., M.S. Interventional Chartered certified accountant  Pager # [phone]

## 2023-08-17 NOTE — Patient Instructions (Addendum)
 Medication Instructions:   NO CHANGES *If you need a refill on your cardiac medications before your next appointment, please call your pharmacy*   Lab Work: NO CHANGES     Testing/Procedures: NOT NEEDED   Follow-Up: At [facility], you and your health needs are our priority.  As part of our continuing mission to provide you with exceptional heart care, we have created designated Provider Care Teams.  These Care Teams include your primary Cardiologist (physician) and Advanced Practice Providers (APPs -  Physician Assistants and Nurse Practitioners) who all work together to provide you with the care you need, when you need it.     Your next appointment:   12 month(s)  The format for your next appointment:   In Person  Provider:   Alm Clay, MD   NEED  AN APPT TO SEE DR CAMNITZ   3 TO 4 MONTHS  Other Instructions     GRADUALLY INCREASE EXERCISE

## 2023-08-22 ENCOUNTER — Encounter: Payer: Self-pay | Admitting: Cardiology

## 2023-08-22 DIAGNOSIS — I10 Essential (primary) hypertension: Secondary | ICD-10-CM | POA: Insufficient documentation

## 2023-08-22 DIAGNOSIS — R5383 Other fatigue: Secondary | ICD-10-CM | POA: Insufficient documentation

## 2023-08-22 NOTE — Assessment & Plan Note (Signed)
 Persistent fatigue post-ablation, improving but not at pre-ablation levels. Likely related to ablation procedure and flecainide  use. - Advise gradual return to exercise, monitor for fatigue. - Discuss potential medication adjustments if fatigue persists.

## 2023-08-22 NOTE — Assessment & Plan Note (Signed)
 Well-controlled BP on HCTZ alone.  Did not tolerate beta-blocker.

## 2023-08-22 NOTE — Assessment & Plan Note (Addendum)
 PVC burden down to 5.7% from previous 14.4% after ablation. No longer on beta-blocker or flecainide . Overall most notable symptom is fatigue And exercise intolerance.  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  Ablation did not eliminate all PVCs due to proximity to heart's electrical system. Current PVC level not dangerous, below 10% threshold. - Will plan to message Dr. Inocencio regarding exercise restrictions and potential stress testing. - Advise gradual increase in exercise intensity, monitor for symptoms. - Consider medication if symptoms worsen with increased activity.

## 2023-09-07 ENCOUNTER — Telehealth: Payer: Self-pay | Admitting: *Deleted

## 2023-09-07 NOTE — Telephone Encounter (Signed)
 Left detailed message informing pt of below MD recommendation/s.

## 2023-09-07 NOTE — Telephone Encounter (Signed)
-----   Message from Will [facility] sent at 08/23/2023  4:18 PM EDT ----- Can you let her know that it is okay to get back into the gym.  She can exercise without restriction.  If she continues to have fatigue with exercise, may need to look at other medications for options of PVC suppression.  Spite this, she is now having a low burden at 5.7%. ----- Message ----- From: Anner Alm ORN, MD Sent: 08/22/2023   8:19 PM EDT To: Soyla Gladis Norton, MD  Will, I saw Rebecca Martinez in clinic last week.  She is doing okay overall but still has not bounced back to full level activity.  She was waiting to hear from you guys just aware that she could get back into full level of exercise.  She has 5.7% PVC burden on her follow-up monitor but really did not tolerate flecainide  or beta-blocker in the past.  Trying to figure out what the next step for her would be.  I think she can probably just build back her level of exercise and she should be okay but wanted some clarification from you.  Alm Anner, MD

## 2023-09-14 DIAGNOSIS — R87810 Cervical high risk human papillomavirus (HPV) DNA test positive: Secondary | ICD-10-CM | POA: Diagnosis not present

## 2023-11-23 ENCOUNTER — Ambulatory Visit: Admitting: Cardiology

## 2023-11-25 DIAGNOSIS — H524 Presbyopia: Secondary | ICD-10-CM | POA: Diagnosis not present

## 2023-11-25 DIAGNOSIS — H35363 Drusen (degenerative) of macula, bilateral: Secondary | ICD-10-CM | POA: Diagnosis not present

## 2023-12-07 DIAGNOSIS — I10 Essential (primary) hypertension: Secondary | ICD-10-CM | POA: Diagnosis not present

## 2023-12-07 DIAGNOSIS — F439 Reaction to severe stress, unspecified: Secondary | ICD-10-CM | POA: Diagnosis not present

## 2023-12-07 DIAGNOSIS — R42 Dizziness and giddiness: Secondary | ICD-10-CM | POA: Diagnosis not present

## 2024-02-23 ENCOUNTER — Ambulatory Visit: Admitting: Cardiology
